# Patient Record
Sex: Female | Born: 1950
Health system: Southern US, Community
[De-identification: ages and names within clinical notes are randomized; demographics above are authoritative.]

## PROBLEM LIST (undated history)

## (undated) DIAGNOSIS — Z8742 Personal history of other diseases of the female genital tract: Secondary | ICD-10-CM

## (undated) DIAGNOSIS — R87629 Unspecified abnormal cytological findings in specimens from vagina: Secondary | ICD-10-CM

## (undated) DIAGNOSIS — I10 Essential (primary) hypertension: Secondary | ICD-10-CM

## (undated) DIAGNOSIS — N816 Rectocele: Secondary | ICD-10-CM

## (undated) DIAGNOSIS — IMO0002 Reserved for concepts with insufficient information to code with codable children: Secondary | ICD-10-CM

## (undated) DIAGNOSIS — R87619 Unspecified abnormal cytological findings in specimens from cervix uteri: Secondary | ICD-10-CM

## (undated) HISTORY — PX: BREAST SURGERY: SHX581

## (undated) HISTORY — PX: OTHER SURGICAL HISTORY: SHX169

## (undated) HISTORY — DX: Unspecified abnormal cytological findings in specimens from cervix uteri: R87.619

## (undated) HISTORY — DX: Reserved for concepts with insufficient information to code with codable children: IMO0002

## (undated) HISTORY — DX: Essential (primary) hypertension: I10

## (undated) HISTORY — DX: Personal history of other diseases of the female genital tract: Z87.42

## (undated) HISTORY — PX: ENDOMETRIAL ABLATION: SHX621

## (undated) HISTORY — DX: Rectocele: N81.6

## (undated) HISTORY — DX: Unspecified abnormal cytological findings in specimens from vagina: R87.629

## (undated) HISTORY — PX: TUBAL LIGATION: SHX77

---

## 2001-08-20 ENCOUNTER — Ambulatory Visit (HOSPITAL_COMMUNITY): Admission: RE | Admit: 2001-08-20 | Discharge: 2001-08-20 | Payer: Self-pay | Admitting: General Surgery

## 2004-05-31 ENCOUNTER — Ambulatory Visit (HOSPITAL_COMMUNITY): Admission: RE | Admit: 2004-05-31 | Discharge: 2004-05-31 | Payer: Self-pay | Admitting: Obstetrics and Gynecology

## 2005-12-17 ENCOUNTER — Encounter (INDEPENDENT_AMBULATORY_CARE_PROVIDER_SITE_OTHER): Payer: Self-pay | Admitting: Family Medicine

## 2005-12-17 LAB — CONVERTED CEMR LAB: Pap Smear: NORMAL

## 2005-12-26 ENCOUNTER — Encounter (INDEPENDENT_AMBULATORY_CARE_PROVIDER_SITE_OTHER): Payer: Self-pay | Admitting: Family Medicine

## 2006-04-03 ENCOUNTER — Encounter (INDEPENDENT_AMBULATORY_CARE_PROVIDER_SITE_OTHER): Payer: Self-pay | Admitting: Family Medicine

## 2006-04-04 ENCOUNTER — Ambulatory Visit: Payer: Self-pay | Admitting: Family Medicine

## 2006-06-05 ENCOUNTER — Encounter: Payer: Self-pay | Admitting: Family Medicine

## 2006-06-05 DIAGNOSIS — L708 Other acne: Secondary | ICD-10-CM

## 2006-06-05 DIAGNOSIS — I10 Essential (primary) hypertension: Secondary | ICD-10-CM | POA: Insufficient documentation

## 2006-06-05 DIAGNOSIS — G5600 Carpal tunnel syndrome, unspecified upper limb: Secondary | ICD-10-CM

## 2006-09-05 ENCOUNTER — Encounter (INDEPENDENT_AMBULATORY_CARE_PROVIDER_SITE_OTHER): Payer: Self-pay | Admitting: Family Medicine

## 2006-12-09 ENCOUNTER — Encounter (INDEPENDENT_AMBULATORY_CARE_PROVIDER_SITE_OTHER): Payer: Self-pay | Admitting: Family Medicine

## 2006-12-09 LAB — CONVERTED CEMR LAB: Pap Smear: NORMAL

## 2007-01-05 ENCOUNTER — Encounter (INDEPENDENT_AMBULATORY_CARE_PROVIDER_SITE_OTHER): Payer: Self-pay | Admitting: Family Medicine

## 2007-01-09 ENCOUNTER — Encounter (INDEPENDENT_AMBULATORY_CARE_PROVIDER_SITE_OTHER): Payer: Self-pay | Admitting: Family Medicine

## 2007-01-13 ENCOUNTER — Ambulatory Visit: Payer: Self-pay | Admitting: Family Medicine

## 2007-01-14 ENCOUNTER — Encounter (INDEPENDENT_AMBULATORY_CARE_PROVIDER_SITE_OTHER): Payer: Self-pay | Admitting: Family Medicine

## 2007-01-14 ENCOUNTER — Telehealth (INDEPENDENT_AMBULATORY_CARE_PROVIDER_SITE_OTHER): Payer: Self-pay | Admitting: *Deleted

## 2007-01-15 ENCOUNTER — Telehealth (INDEPENDENT_AMBULATORY_CARE_PROVIDER_SITE_OTHER): Payer: Self-pay | Admitting: *Deleted

## 2007-01-15 ENCOUNTER — Encounter (INDEPENDENT_AMBULATORY_CARE_PROVIDER_SITE_OTHER): Payer: Self-pay | Admitting: Family Medicine

## 2007-02-05 ENCOUNTER — Encounter (INDEPENDENT_AMBULATORY_CARE_PROVIDER_SITE_OTHER): Payer: Self-pay | Admitting: Family Medicine

## 2007-02-05 ENCOUNTER — Ambulatory Visit: Payer: Self-pay | Admitting: Gastroenterology

## 2007-02-05 ENCOUNTER — Ambulatory Visit (HOSPITAL_COMMUNITY): Admission: RE | Admit: 2007-02-05 | Discharge: 2007-02-05 | Payer: Self-pay | Admitting: Gastroenterology

## 2007-02-24 ENCOUNTER — Ambulatory Visit: Payer: Self-pay | Admitting: Family Medicine

## 2007-04-04 ENCOUNTER — Encounter (INDEPENDENT_AMBULATORY_CARE_PROVIDER_SITE_OTHER): Payer: Self-pay | Admitting: Family Medicine

## 2007-05-20 ENCOUNTER — Ambulatory Visit: Payer: Self-pay | Admitting: Family Medicine

## 2007-05-27 ENCOUNTER — Encounter (INDEPENDENT_AMBULATORY_CARE_PROVIDER_SITE_OTHER): Payer: Self-pay | Admitting: Family Medicine

## 2007-06-11 ENCOUNTER — Telehealth (INDEPENDENT_AMBULATORY_CARE_PROVIDER_SITE_OTHER): Payer: Self-pay | Admitting: Family Medicine

## 2007-06-17 ENCOUNTER — Ambulatory Visit: Payer: Self-pay | Admitting: Family Medicine

## 2007-06-17 LAB — CONVERTED CEMR LAB: Cholesterol, target level: 200 mg/dL

## 2007-06-25 ENCOUNTER — Encounter (INDEPENDENT_AMBULATORY_CARE_PROVIDER_SITE_OTHER): Payer: Self-pay | Admitting: Family Medicine

## 2007-07-29 ENCOUNTER — Ambulatory Visit: Payer: Self-pay | Admitting: Family Medicine

## 2007-08-02 ENCOUNTER — Encounter (INDEPENDENT_AMBULATORY_CARE_PROVIDER_SITE_OTHER): Payer: Self-pay | Admitting: Family Medicine

## 2007-08-04 LAB — CONVERTED CEMR LAB
ALT: 20 units/L (ref 0–35)
BUN: 14 mg/dL (ref 6–23)
Basophils Absolute: 0.1 10*3/uL (ref 0.0–0.1)
Basophils Relative: 1 % (ref 0–1)
CO2: 29 meq/L (ref 19–32)
Calcium: 9.6 mg/dL (ref 8.4–10.5)
Cholesterol: 141 mg/dL (ref 0–200)
Creatinine, Ser: 0.78 mg/dL (ref 0.40–1.20)
Eosinophils Relative: 2 % (ref 0–5)
HCT: 39.1 % (ref 36.0–46.0)
HDL: 55 mg/dL (ref >39)
Hemoglobin: 13 g/dL (ref 12.0–15.0)
Lymphocytes Relative: 24 % (ref 12–46)
MCHC: 33.2 g/dL (ref 30.0–36.0)
Monocytes Absolute: 0.9 10*3/uL (ref 0.1–1.0)
Monocytes Relative: 9 % (ref 3–12)
RBC: 4.5 M/uL (ref 3.87–5.11)
RDW: 13.6 % (ref 11.5–15.5)
Total Bilirubin: 0.5 mg/dL (ref 0.3–1.2)
Total CHOL/HDL Ratio: 2.6
VLDL: 10 mg/dL (ref 0–40)

## 2007-08-05 ENCOUNTER — Telehealth (INDEPENDENT_AMBULATORY_CARE_PROVIDER_SITE_OTHER): Payer: Self-pay | Admitting: *Deleted

## 2007-08-06 ENCOUNTER — Encounter (INDEPENDENT_AMBULATORY_CARE_PROVIDER_SITE_OTHER): Payer: Self-pay | Admitting: Family Medicine

## 2007-12-23 ENCOUNTER — Ambulatory Visit: Payer: Self-pay | Admitting: Family Medicine

## 2007-12-23 LAB — CONVERTED CEMR LAB
Blood in Urine, dipstick: NEGATIVE
Nitrite: NEGATIVE
Protein, U semiquant: NEGATIVE
Urobilinogen, UA: 1
pH: 7

## 2007-12-25 LAB — CONVERTED CEMR LAB
BUN: 13 mg/dL (ref 6–23)
CO2: 29 meq/L (ref 19–32)
Calcium: 9.7 mg/dL (ref 8.4–10.5)
Chloride: 106 meq/L (ref 96–112)
Creatinine, Ser: 0.83 mg/dL (ref 0.40–1.20)
Glucose, Bld: 104 mg/dL — ABNORMAL HIGH (ref 70–99)

## 2008-01-15 ENCOUNTER — Other Ambulatory Visit: Admission: RE | Admit: 2008-01-15 | Discharge: 2008-01-15 | Payer: Self-pay | Admitting: Obstetrics and Gynecology

## 2008-01-15 ENCOUNTER — Encounter (INDEPENDENT_AMBULATORY_CARE_PROVIDER_SITE_OTHER): Payer: Self-pay | Admitting: Family Medicine

## 2008-05-06 ENCOUNTER — Encounter (INDEPENDENT_AMBULATORY_CARE_PROVIDER_SITE_OTHER): Payer: Self-pay | Admitting: Family Medicine

## 2008-05-12 ENCOUNTER — Encounter (INDEPENDENT_AMBULATORY_CARE_PROVIDER_SITE_OTHER): Payer: Self-pay | Admitting: Family Medicine

## 2008-05-17 ENCOUNTER — Ambulatory Visit: Payer: Self-pay | Admitting: Family Medicine

## 2008-05-18 ENCOUNTER — Encounter (INDEPENDENT_AMBULATORY_CARE_PROVIDER_SITE_OTHER): Payer: Self-pay | Admitting: Family Medicine

## 2008-05-18 LAB — CONVERTED CEMR LAB: Mumps IgG: 2.32 — ABNORMAL HIGH

## 2008-05-24 ENCOUNTER — Encounter (INDEPENDENT_AMBULATORY_CARE_PROVIDER_SITE_OTHER): Payer: Self-pay | Admitting: Family Medicine

## 2008-05-27 ENCOUNTER — Encounter (INDEPENDENT_AMBULATORY_CARE_PROVIDER_SITE_OTHER): Payer: Self-pay | Admitting: Family Medicine

## 2008-06-14 ENCOUNTER — Ambulatory Visit: Payer: Self-pay | Admitting: Family Medicine

## 2008-06-16 ENCOUNTER — Encounter (INDEPENDENT_AMBULATORY_CARE_PROVIDER_SITE_OTHER): Payer: Self-pay | Admitting: Family Medicine

## 2008-06-16 LAB — CONVERTED CEMR LAB
BUN: 13 mg/dL (ref 6–23)
CO2: 21 meq/L (ref 19–32)
Chloride: 104 meq/L (ref 96–112)
Creatinine, Ser: 0.8 mg/dL (ref 0.40–1.20)
Potassium: 4 meq/L (ref 3.5–5.3)

## 2008-08-30 ENCOUNTER — Encounter (INDEPENDENT_AMBULATORY_CARE_PROVIDER_SITE_OTHER): Payer: Self-pay | Admitting: Family Medicine

## 2008-10-15 ENCOUNTER — Ambulatory Visit: Payer: Self-pay | Admitting: Family Medicine

## 2008-10-29 ENCOUNTER — Ambulatory Visit: Payer: Self-pay | Admitting: Family Medicine

## 2008-10-29 DIAGNOSIS — K279 Peptic ulcer, site unspecified, unspecified as acute or chronic, without hemorrhage or perforation: Secondary | ICD-10-CM | POA: Insufficient documentation

## 2008-11-03 ENCOUNTER — Encounter (INDEPENDENT_AMBULATORY_CARE_PROVIDER_SITE_OTHER): Payer: Self-pay | Admitting: Family Medicine

## 2008-11-15 ENCOUNTER — Ambulatory Visit: Payer: Self-pay | Admitting: Family Medicine

## 2008-12-31 ENCOUNTER — Telehealth (INDEPENDENT_AMBULATORY_CARE_PROVIDER_SITE_OTHER): Payer: Self-pay | Admitting: *Deleted

## 2009-02-17 ENCOUNTER — Other Ambulatory Visit: Admission: RE | Admit: 2009-02-17 | Discharge: 2009-02-17 | Payer: Self-pay | Admitting: Adult Health

## 2009-03-29 ENCOUNTER — Encounter (INDEPENDENT_AMBULATORY_CARE_PROVIDER_SITE_OTHER): Payer: Self-pay | Admitting: Family Medicine

## 2010-11-21 NOTE — Op Note (Signed)
NAME:  Kristy Oneal, Kristy Oneal                ACCOUNT NO.:  [id number]   MEDICAL RECORD NO.:  [id number]          PATIENT TYPE:  AMB   LOCATION:  DAY                           FACILITY:  [facility]   PHYSICIAN:  Kassie Mends, M.D.      DATE OF BIRTH:  15-Feb-1951   DATE OF PROCEDURE:  02/05/2007  DATE OF DISCHARGE:                               OPERATIVE REPORT   PROCEDURE:  Colonoscopy.   INDICATIONS FOR EXAM:  Kristy Oneal is a 60 year old female who presented  for average risk colon cancer screening.   FINDINGS:  1. Normal colon without evidence of polyps, masses, inflammatory      changes, diverticula, or AVMs.  2. Normal retroflexed view of the rectum.   RECOMMENDATIONS:  1. Screening colonoscopy in ten years.  2. She should follow a high fiber diet, she is given a hand out on      high fiber diet.   MEDICATIONS:  1. Demerol 50 mg IV.  2. Versed 2 mg IV.   PROCEDURE TECHNIQUE:  Physical exam was performed and informed consent  was obtained from the patient after explaining the benefits, risks, and  alternatives to the procedure.  The patient was connected to the monitor  and placed in the left lateral position.  Continuous oxygen was provided  by nasal cannula and IV medicines administered through an indwelling  cannula.  After administration of sedation and rectal exam, the  patient's rectum was intubated.  The scope was advanced under direct  visualization to the cecum.  The scope was removed slowly by carefully  examining the color, texture, anatomy, and integrity of the mucosa on  the way out.  The patient was recovered in the endoscopy suite and  discharged home in satisfactory condition.      Kassie Mends, M.D.  Electronically Signed     SM/MEDQ  D:  02/05/2007  T:  02/05/2007  Job:  [id number]   cc:   Franchot Heidelberg, M.D.

## 2010-11-24 NOTE — Op Note (Signed)
NAME:  Kristy Oneal, Kristy Oneal                ACCOUNT NO.:  [id number]   MEDICAL RECORD NO.:  [id number]          PATIENT TYPE:  AMB   LOCATION:  DAY                           FACILITY:  [facility]   PHYSICIAN:  Tilda Burrow, M.D. DATE OF BIRTH:  1951/03/10   DATE OF PROCEDURE:  05/31/2004  DATE OF DISCHARGE:  05/31/2004                                 OPERATIVE REPORT   PREOPERATIVE DIAGNOSIS:  Heavy and prolonged menses on hormone replacement  therapy.   POSTOPERATIVE DIAGNOSIS:  Heavy and prolonged menses on hormone replacement  therapy.   PROCEDURES:  Hysteroscopy, dilation and curettage, endometrial ablation,  cervix biopsy.   SURGEON:  Tilda Burrow, M.D.   ASSISTANT:  None.   ANESTHESIA:  General.   COMPLICATIONS:  None.   FINDINGS:  Thin uterine cavity.  Benign cervical mucus.   DETAILS OF PROCEDURE:  The patient was taken to the operating room, prepped  and draped for low lithotomy, leg support, [place] procedure.  Speculum was  inserted and the cervix grasped with single-tooth tenaculum, paracervical  block was infiltrated.  We dilated the cervix to 61 [place], allowing the  introduction of rigid hysteroscope and the water-based hysteroscopy  performed.  Endometrial cavity was relatively thin.  Recent abnormal Pap  smear resulted in endocervical curettage and cervix biopsy to be performed.  The specimens were taken out without difficulty.  Dilation and curettage was  then performed with smooth sharp curetting in all quadrants to obtain any  tissue remnants.  There were no identified polyps.  We then used the  endometrial ablation device to insert it into the uterus and perform thermal  ablation using the HTA endometrial ablation equipment with smooth, sharp  uniform appearance to the endometrial cavity after its 10-minute heat cycle.  The uterus sounded only to a depth of 8 cm.  The procedure was successfully  completed and the patient then allowed to go to the recovery room  in good  condition.     [name]   JVF/MEDQ  D:  06/14/2004  T:  06/15/2004  Job:  [id number]

## 2010-11-24 NOTE — H&P (Signed)
[facility]  Patient:    Kristy Oneal, Kristy Oneal Visit Number: [id number] MRN: [id number]          Service Type: Attending:  Elpidio Anis, M.D. Dictated by:   Elpidio Anis, M.D. Adm. Date:  08/20/01                           History and Physical  HISTORY OF PRESENT ILLNESS:  A 60 year old female with family history of colon cancer.  She has chronic anemia.  No history of rectal bleeding or melena.  No difficulty with constipation.  PAST MEDICAL HISTORY:  Hypertension, peptic ulcer disease, pancreatitis.  MEDICATIONS:  Lotrel, Cardura, and an iron supplement.  PAST SURGICAL HISTORY:  Right breast biopsy and cauterization of cervix.  REVIEW OF SYSTEMS:  Only positive for heartburn and mild abdominal pain.  PHYSICAL EXAMINATION:  VITAL SIGNS:  Blood pressure 130/82, pulse 84, respirations 18, weight 123 pounds.  HEENT:  Unremarkable.  NECK:  Supple without JVD or bruit.  CHEST:  Clear to auscultation.  HEART:  Regular rate and rhythm without murmur, gallop, or rub.  ABDOMEN:  Soft and nontender.  No masses.  EXTREMITIES:  No cyanosis, clubbing, or edema.  NEUROLOGIC:  Nonfocal motor, sensory, or cerebellar deficit.  IMPRESSION: 1. Family history of colon cancer. 2. Chronic anemia. 3. Hypertension. 4. Peptic ulcer disease.  PLAN:  Total colonoscopy.  The patient was thoroughly counseled for the surgery and potential complications.  Written literature was given. Dictated by:   Elpidio Anis, M.D. Attending:  Elpidio Anis, M.D. DD:  08/19/01 TD:  08/19/01 Job: [id number]

## 2011-03-08 ENCOUNTER — Other Ambulatory Visit: Payer: Self-pay | Admitting: Obstetrics & Gynecology

## 2011-03-08 ENCOUNTER — Other Ambulatory Visit (HOSPITAL_COMMUNITY)
Admission: RE | Admit: 2011-03-08 | Discharge: 2011-03-08 | Disposition: A | Discharge status: Home / Self Care | Payer: BC Managed Care – PPO | Source: Ambulatory Visit | Attending: Obstetrics and Gynecology | Admitting: Obstetrics and Gynecology

## 2011-03-08 ENCOUNTER — Other Ambulatory Visit: Payer: Self-pay | Admitting: Adult Health

## 2011-03-08 DIAGNOSIS — Z139 Encounter for screening, unspecified: Secondary | ICD-10-CM

## 2011-03-08 DIAGNOSIS — Z780 Asymptomatic menopausal state: Secondary | ICD-10-CM

## 2011-03-08 DIAGNOSIS — Z01419 Encounter for gynecological examination (general) (routine) without abnormal findings: Secondary | ICD-10-CM | POA: Insufficient documentation

## 2011-03-08 DIAGNOSIS — R6252 Short stature (child): Secondary | ICD-10-CM

## 2011-03-14 ENCOUNTER — Ambulatory Visit (HOSPITAL_COMMUNITY)
Admission: RE | Admit: 2011-03-14 | Discharge: 2011-03-14 | Disposition: A | Discharge status: Home / Self Care | Payer: BC Managed Care – PPO | Source: Ambulatory Visit | Attending: Obstetrics & Gynecology | Admitting: Obstetrics & Gynecology

## 2011-03-14 DIAGNOSIS — M899 Disorder of bone, unspecified: Secondary | ICD-10-CM | POA: Insufficient documentation

## 2011-03-14 DIAGNOSIS — Z780 Asymptomatic menopausal state: Secondary | ICD-10-CM | POA: Insufficient documentation

## 2011-03-14 DIAGNOSIS — R6252 Short stature (child): Secondary | ICD-10-CM

## 2011-03-14 DIAGNOSIS — Z139 Encounter for screening, unspecified: Secondary | ICD-10-CM

## 2011-03-14 DIAGNOSIS — Z13820 Encounter for screening for osteoporosis: Secondary | ICD-10-CM | POA: Insufficient documentation

## 2011-08-06 ENCOUNTER — Ambulatory Visit (HOSPITAL_COMMUNITY): Payer: Self-pay | Admitting: Oncology

## 2012-03-13 ENCOUNTER — Other Ambulatory Visit (HOSPITAL_COMMUNITY)
Admission: RE | Admit: 2012-03-13 | Discharge: 2012-03-13 | Disposition: A | Discharge status: Home / Self Care | Payer: BC Managed Care – PPO | Source: Ambulatory Visit | Attending: Obstetrics and Gynecology | Admitting: Obstetrics and Gynecology

## 2012-03-13 ENCOUNTER — Other Ambulatory Visit: Payer: Self-pay | Admitting: Adult Health

## 2012-03-13 DIAGNOSIS — Z01419 Encounter for gynecological examination (general) (routine) without abnormal findings: Secondary | ICD-10-CM | POA: Insufficient documentation

## 2012-03-13 DIAGNOSIS — R87810 Cervical high risk human papillomavirus (HPV) DNA test positive: Secondary | ICD-10-CM | POA: Insufficient documentation

## 2013-03-18 ENCOUNTER — Other Ambulatory Visit (HOSPITAL_COMMUNITY)
Admission: RE | Admit: 2013-03-18 | Discharge: 2013-03-18 | Disposition: A | Discharge status: Home / Self Care | Payer: BC Managed Care – PPO | Source: Ambulatory Visit | Attending: Adult Health | Admitting: Adult Health

## 2013-03-18 ENCOUNTER — Ambulatory Visit (INDEPENDENT_AMBULATORY_CARE_PROVIDER_SITE_OTHER): Payer: BC Managed Care – PPO | Admitting: Adult Health

## 2013-03-18 ENCOUNTER — Encounter: Payer: Self-pay | Admitting: Adult Health

## 2013-03-18 VITALS — BP 128/76 | HR 72 | Ht 61.0 in | Wt 113.0 lb

## 2013-03-18 DIAGNOSIS — Z01419 Encounter for gynecological examination (general) (routine) without abnormal findings: Secondary | ICD-10-CM | POA: Insufficient documentation

## 2013-03-18 DIAGNOSIS — Z1151 Encounter for screening for human papillomavirus (HPV): Secondary | ICD-10-CM | POA: Insufficient documentation

## 2013-03-18 DIAGNOSIS — Z1212 Encounter for screening for malignant neoplasm of rectum: Secondary | ICD-10-CM

## 2013-03-18 LAB — HEMOCCULT GUIAC POC 1CARD (OFFICE)

## 2013-03-18 NOTE — Progress Notes (Signed)
Patient ID: Kristy Oneal, female   DOB: [birth year], 62 y.o.   MRN: [id number] History of Present Illness: Kristy Oneal is a 62 year old black female married in for pap and physical.   Current Medications, Allergies, Past Medical History, Past Surgical History, Family History and Social History were reviewed in [facility] electronic medical record.     Review of Systems: Patient denies any headaches, blurred vision, shortness of breath, chest pain, abdominal pain, problems with bowel movements, urination, or intercourse. No joint pains or mood chnages    Physical Exam:BP 128/76  Pulse 72  Ht 5\' 1"  (1.549 m)  Wt 113 lb (51.256 kg)  BMI 21.36 kg/m2 General:  Well developed, well nourished, no acute distress Skin:  Warm and dry Neck:  Midline trachea, normal thyroid Lungs; Clear to auscultation bilaterally Breast:  No dominant palpable mass, retraction, or nipple discharge Cardiovascular: Regular rate and rhythm Abdomen:  Soft, non tender, no hepatosplenomegaly Pelvic:  External genitalia is normal in appearance.  The vagina is normal in appearance. The cervix is atrophic, pap with HPV performed.  Uterus is felt to be normal size, shape, and contour.  No                adnexal masses or tenderness noted. Rectal: Good sphincter tone, no polyps, or hemorrhoids felt.  Hemoccult negative. Extremities:  No swelling or varicosities noted Psych:  Alert and cooperative, seems happy, still working and recently adopted 62 year old girl from her niece.   Impression: Yearly gyn exam History hypertension    Plan: Physical in 1 year Mammogram yearly Colonoscopy per GI Labs in near future Get flu shot

## 2013-03-18 NOTE — Patient Instructions (Addendum)
Physical in 1 year Mammogram yearly Colonoscopy per GI Labs near future Get flu shot

## 2013-03-24 ENCOUNTER — Telehealth: Payer: Self-pay | Admitting: Adult Health

## 2013-03-24 NOTE — Telephone Encounter (Signed)
Left message to call about pap in am

## 2013-04-06 ENCOUNTER — Encounter: Payer: Self-pay | Admitting: Obstetrics & Gynecology

## 2013-04-06 ENCOUNTER — Ambulatory Visit (INDEPENDENT_AMBULATORY_CARE_PROVIDER_SITE_OTHER): Payer: BC Managed Care – PPO | Admitting: Obstetrics & Gynecology

## 2013-04-06 VITALS — BP 128/80 | Ht 60.0 in | Wt 113.5 lb

## 2013-04-06 DIAGNOSIS — N870 Mild cervical dysplasia: Secondary | ICD-10-CM

## 2013-04-06 NOTE — Progress Notes (Signed)
Patient ID: Kristy Oneal, female   DOB: 1950-12-05, 62 y.o.   MRN: [id number]       Kesley had pap which revealed LSIL no High risk HPV was detected Colposcopy today reveals negative acetowhite changes, no mosaicism no punctation and no abnormal vessels  Recommend repeat pap in 1 yearsince has negative HPV

## 2014-03-22 ENCOUNTER — Encounter: Payer: Self-pay | Admitting: Adult Health

## 2014-03-22 ENCOUNTER — Ambulatory Visit (INDEPENDENT_AMBULATORY_CARE_PROVIDER_SITE_OTHER): Payer: BC Managed Care – PPO | Admitting: Adult Health

## 2014-03-22 ENCOUNTER — Other Ambulatory Visit (HOSPITAL_COMMUNITY)
Admission: RE | Admit: 2014-03-22 | Discharge: 2014-03-22 | Disposition: A | Discharge status: Home / Self Care | Payer: BC Managed Care – PPO | Source: Ambulatory Visit | Attending: Adult Health | Admitting: Adult Health

## 2014-03-22 VITALS — BP 130/70 | HR 74 | Ht 61.0 in | Wt 110.5 lb

## 2014-03-22 DIAGNOSIS — Z01419 Encounter for gynecological examination (general) (routine) without abnormal findings: Secondary | ICD-10-CM

## 2014-03-22 DIAGNOSIS — Z1151 Encounter for screening for human papillomavirus (HPV): Secondary | ICD-10-CM | POA: Diagnosis present

## 2014-03-22 DIAGNOSIS — N816 Rectocele: Secondary | ICD-10-CM

## 2014-03-22 DIAGNOSIS — Z8742 Personal history of other diseases of the female genital tract: Secondary | ICD-10-CM | POA: Insufficient documentation

## 2014-03-22 DIAGNOSIS — Z1212 Encounter for screening for malignant neoplasm of rectum: Secondary | ICD-10-CM

## 2014-03-22 DIAGNOSIS — R87810 Cervical high risk human papillomavirus (HPV) DNA test positive: Secondary | ICD-10-CM | POA: Diagnosis present

## 2014-03-22 HISTORY — DX: Personal history of other diseases of the female genital tract: Z87.42

## 2014-03-22 HISTORY — DX: Rectocele: N81.6

## 2014-03-22 LAB — CBC
HCT: 36.9 % (ref 36.0–46.0)
HEMOGLOBIN: 12.3 g/dL (ref 12.0–15.0)
MCH: 28.1 pg (ref 26.0–34.0)
MCHC: 33.3 g/dL (ref 30.0–36.0)
MCV: 84.2 fL (ref 78.0–100.0)
Platelets: 252 10*3/uL (ref 150–400)
RBC: 4.38 MIL/uL (ref 3.87–5.11)
RDW: 13.5 % (ref 11.5–15.5)
WBC: 4.9 10*3/uL (ref 4.0–10.5)

## 2014-03-22 LAB — COMPREHENSIVE METABOLIC PANEL
ALT: 17 U/L (ref 0–35)
AST: 21 U/L (ref 0–37)
Albumin: 4.2 g/dL (ref 3.5–5.2)
Alkaline Phosphatase: 72 U/L (ref 39–117)
BUN: 10 mg/dL (ref 6–23)
CO2: 33 meq/L — AB (ref 19–32)
Calcium: 9.6 mg/dL (ref 8.4–10.5)
Chloride: 103 mEq/L (ref 96–112)
Creat: 0.67 mg/dL (ref 0.50–1.10)
Glucose, Bld: 86 mg/dL (ref 70–99)
Potassium: 3.8 mEq/L (ref 3.5–5.3)
Sodium: 139 mEq/L (ref 135–145)
Total Bilirubin: 0.4 mg/dL (ref 0.2–1.2)
Total Protein: 6.6 g/dL (ref 6.0–8.3)

## 2014-03-22 LAB — LIPID PANEL
CHOLESTEROL: 159 mg/dL (ref 0–200)
HDL: 62 mg/dL (ref >39)
LDL Cholesterol: 71 mg/dL (ref 0–99)
Total CHOL/HDL Ratio: 2.6 Ratio
Triglycerides: 129 mg/dL (ref <150)
VLDL: 26 mg/dL (ref 0–40)

## 2014-03-22 LAB — HEMOCCULT GUIAC POC 1CARD (OFFICE): FECAL OCCULT BLD: NEGATIVE

## 2014-03-22 LAB — TSH: TSH: 1.07 u[IU]/mL (ref 0.350–4.500)

## 2014-03-22 NOTE — Patient Instructions (Signed)
Continue meds Physical in 1 year Mammogram yearly Colonoscopy 2019

## 2014-03-22 NOTE — Progress Notes (Signed)
Patient ID: Kristy Oneal, female   DOB: July 19, 1950, 63 y.o.   MRN: [id number] History of Present Illness: Kristy Oneal is a 63 year old black female, married in for a pap and physical.She had a abnormal pap,LSIL,03/18/13.   Current Medications, Allergies, Past Medical History, Past Surgical History, Family History and Social History were reviewed in [employer] record.     Review of Systems: Patient denies any headaches, blurred vision, shortness of breath, chest pain, abdominal pain, problems with bowel movements, urination, or intercourse. No joint pan or mood swings.    Physical Exam:BP 130/70  Pulse 74  Ht  (1.549 m)  Wt 110 lb 8 oz (50.122 kg)  BMI 20.89 kg/m2 General:  Well developed, well nourished, no acute distress Skin:  Warm and dry Neck:  Midline trachea, normal thyroid Lungs; Clear to auscultation bilaterally Breast:  No dominant palpable mass, retraction, or nipple discharge Cardiovascular: Regular rate and rhythm Abdomen:  Soft, non tender, no hepatosplenomegaly Pelvic:  External genitalia is normal in appearance.  The vagina is normal in appearance. The cervix is smooth and stenotic at os,pap with HPV performed.  Uterus is felt to be normal size, shape, and contour.  No  adnexal masses or tenderness noted. Rectal: Good sphincter tone, no polyps, or hemorrhoids felt.  Hemoccult negative.+ mild rectocele. Extremities:  No swelling or varicosities noted Psych:  No mood changes,alert and cooperative,seems happy,still working   Impression: Yearly gyn exam  History of abnormal pap Rectocele     Plan: Physical in 1 year Mammogram yearly Colonoscopy in 2019 Check CBC,CMP,TSH and lipids

## 2014-03-23 LAB — CYTOLOGY - PAP

## 2014-03-24 ENCOUNTER — Telehealth: Payer: Self-pay | Admitting: Adult Health

## 2014-03-24 NOTE — Telephone Encounter (Signed)
Left message to call tomorrow for labs

## 2014-03-25 ENCOUNTER — Telehealth: Payer: Self-pay | Admitting: Adult Health

## 2014-03-25 NOTE — Telephone Encounter (Signed)
Pt aware labs great and pap normal with +HPV, will repeat pap next year

## 2014-05-10 ENCOUNTER — Encounter: Payer: Self-pay | Admitting: Adult Health

## 2014-09-09 ENCOUNTER — Other Ambulatory Visit (HOSPITAL_COMMUNITY): Payer: Self-pay | Admitting: Family Medicine

## 2014-09-09 DIAGNOSIS — N951 Menopausal and female climacteric states: Secondary | ICD-10-CM

## 2014-09-14 ENCOUNTER — Other Ambulatory Visit (HOSPITAL_COMMUNITY): Payer: BC Managed Care – PPO

## 2014-10-05 ENCOUNTER — Ambulatory Visit (HOSPITAL_COMMUNITY)
Admission: RE | Admit: 2014-10-05 | Discharge: 2014-10-05 | Disposition: A | Discharge status: Home / Self Care | Payer: BC Managed Care – PPO | Source: Ambulatory Visit | Attending: Family Medicine | Admitting: Family Medicine

## 2014-10-05 DIAGNOSIS — N951 Menopausal and female climacteric states: Secondary | ICD-10-CM | POA: Diagnosis not present

## 2015-03-30 ENCOUNTER — Ambulatory Visit (INDEPENDENT_AMBULATORY_CARE_PROVIDER_SITE_OTHER): Payer: BC Managed Care – PPO | Admitting: Adult Health

## 2015-03-30 ENCOUNTER — Other Ambulatory Visit: Payer: BC Managed Care – PPO | Admitting: Adult Health

## 2015-03-30 ENCOUNTER — Other Ambulatory Visit (HOSPITAL_COMMUNITY)
Admission: RE | Admit: 2015-03-30 | Discharge: 2015-03-30 | Disposition: A | Discharge status: Home / Self Care | Payer: BC Managed Care – PPO | Source: Ambulatory Visit | Attending: Adult Health | Admitting: Adult Health

## 2015-03-30 ENCOUNTER — Encounter: Payer: Self-pay | Admitting: Adult Health

## 2015-03-30 VITALS — BP 110/64 | HR 84 | Ht 60.0 in | Wt 113.0 lb

## 2015-03-30 DIAGNOSIS — Z01419 Encounter for gynecological examination (general) (routine) without abnormal findings: Secondary | ICD-10-CM

## 2015-03-30 DIAGNOSIS — Z8742 Personal history of other diseases of the female genital tract: Secondary | ICD-10-CM

## 2015-03-30 DIAGNOSIS — Z1212 Encounter for screening for malignant neoplasm of rectum: Secondary | ICD-10-CM | POA: Diagnosis not present

## 2015-03-30 DIAGNOSIS — Z1151 Encounter for screening for human papillomavirus (HPV): Secondary | ICD-10-CM | POA: Insufficient documentation

## 2015-03-30 LAB — HEMOCCULT GUIAC POC 1CARD (OFFICE): Fecal Occult Blood, POC: NEGATIVE

## 2015-03-30 NOTE — Progress Notes (Signed)
Patient ID: Kristy Oneal, female   DOB: 04/07/1951, 64 y.o.   MRN: [id number] History of Present Illness: [name] is a 64 year old black female, married in for a well woman gyn exam and pap. PCP is S.Jackson,PA at [place].  Current Medications, Allergies, Past Medical History, Past Surgical History, Family History and Social History were reviewed in [employer] record.     Review of Systems: Patient denies any headaches, hearing loss, fatigue, blurred vision, shortness of breath, chest pain, abdominal pain, problems with bowel movements, urination, or intercourse. No joint pain or mood swings.    Physical Exam:BP 110/64 mmHg  Pulse 84  Ht 5' (1.524 m)  Wt 113 lb (51.256 kg)  BMI 22.07 kg/m2 General:  Well developed, well nourished, no acute distress Skin:  Warm and dry Neck:  Midline trachea, normal thyroid, good ROM, no lymphadenopathy Lungs; Clear to auscultation bilaterally Breast:  No dominant palpable mass, retraction, or nipple discharge Cardiovascular: Regular rate and rhythm Abdomen:  Soft, non tender, no hepatosplenomegaly Pelvic:  External genitalia is normal in appearance, no lesions.  The vagina is normal in appearance. Urethra has no lesions or masses. The cervix is smooth and stenotic at the os, pap with HPV performed.  Uterus is felt to be normal size, shape, and contour.  No adnexal masses or tenderness noted.Bladder is non tender, no masses felt. Rectal: Good sphincter tone, no polyps, or hemorrhoids felt.  Hemoccult negative.+rectocele Extremities/musculoskeletal:  No swelling or varicosities noted, no clubbing or cyanosis Psych:  No mood changes, alert and cooperative,seems happy   Impression: Well woman gyn exam and pap History of abnormal pap    Plan: Physical in 1 year Mammogram yearly Labs with PCP Colonoscopy per GI

## 2015-03-30 NOTE — Patient Instructions (Signed)
Physical in 1 year Mammogram yearly Labs with PCP Colonoscopy per GI 

## 2015-04-05 LAB — CYTOLOGY - PAP

## 2015-04-07 ENCOUNTER — Telehealth: Payer: Self-pay | Admitting: Adult Health

## 2015-04-07 NOTE — Telephone Encounter (Signed)
Pt aware pap negative but +HPV, will repeat pap in 1 year 

## 2016-03-07 DIAGNOSIS — I10 Essential (primary) hypertension: Secondary | ICD-10-CM | POA: Diagnosis not present

## 2016-03-07 DIAGNOSIS — Z6821 Body mass index (BMI) 21.0-21.9, adult: Secondary | ICD-10-CM | POA: Diagnosis not present

## 2016-03-09 IMAGING — US US RENAL
1 series · 14 of 25 positions shown · non-contrast
Comparison: None.

CLINICAL DATA: Acute on chronic renal failure, chronic kidney
disease stage 3.

EXAM:
RENAL / URINARY TRACT ULTRASOUND COMPLETE

[Series 1: us renal · 0.19mm/px · 14 of 50 slices shown]
[im 1/50]
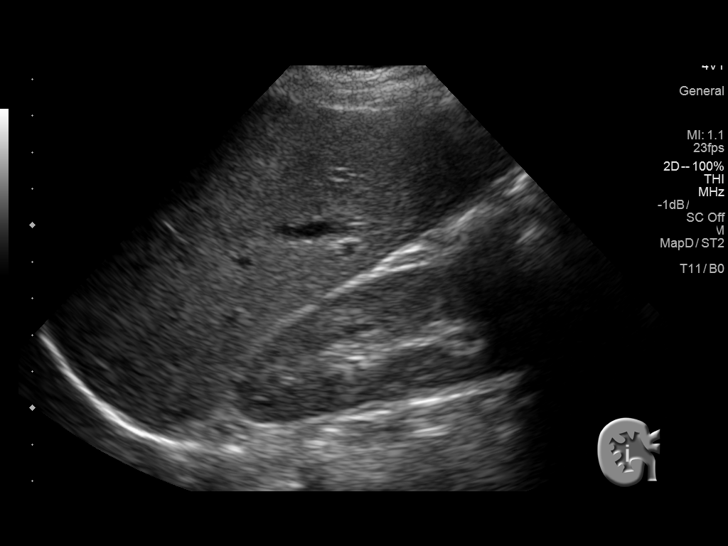
[im 5/50]
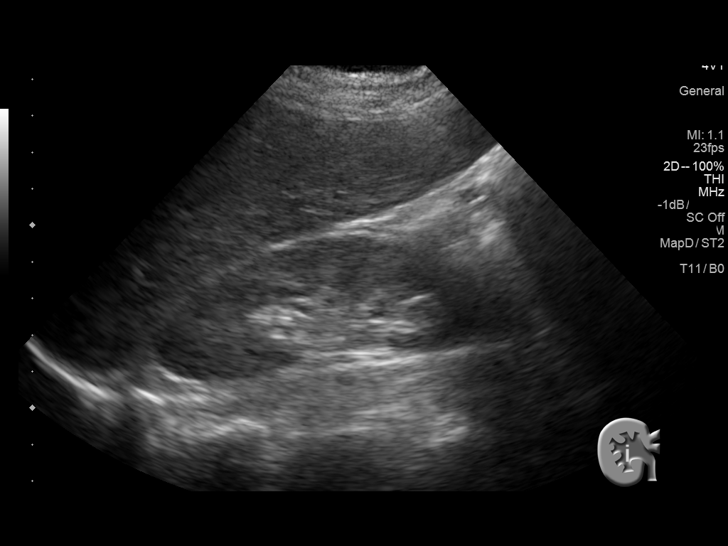
[im 9/50]
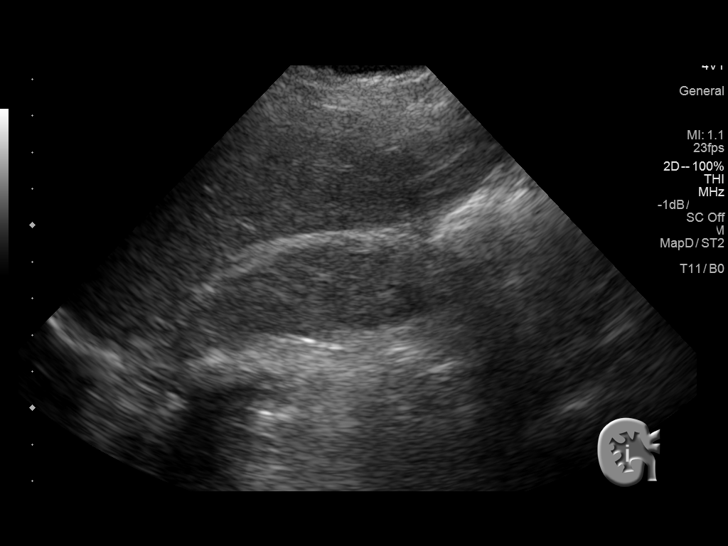
[im 13/50]
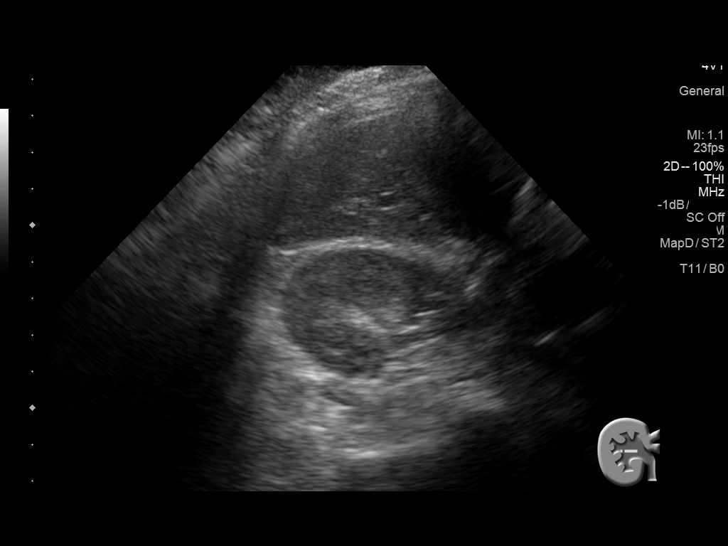
[im 17/50]
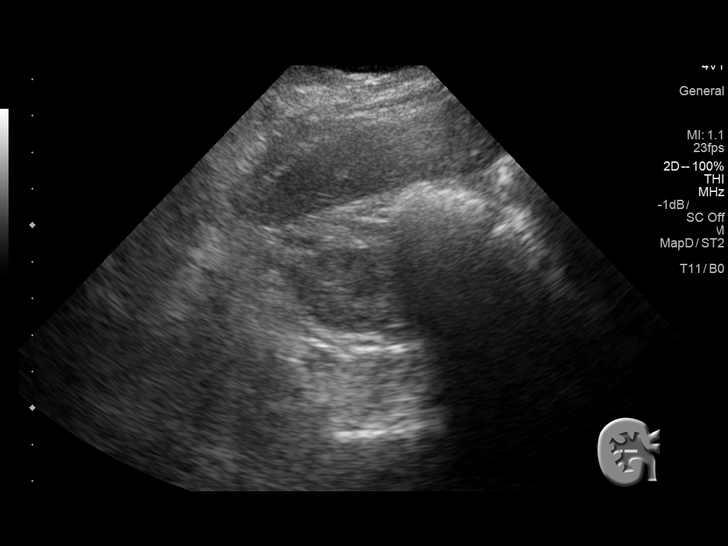
[im 19/50]
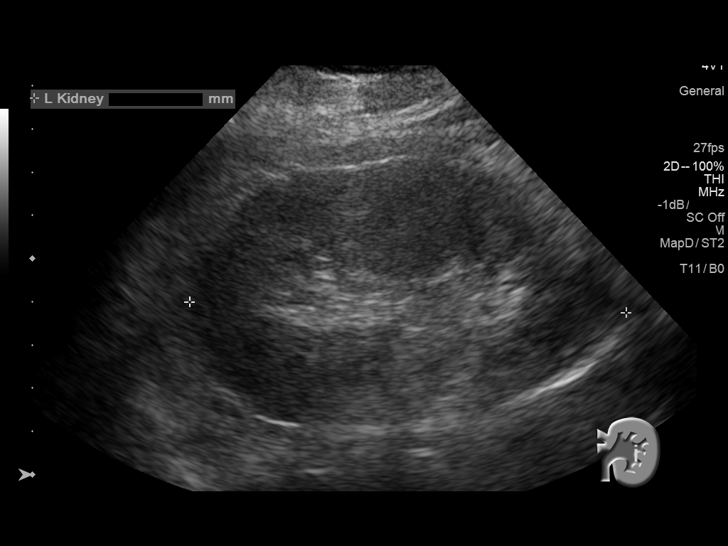
[im 23/50]
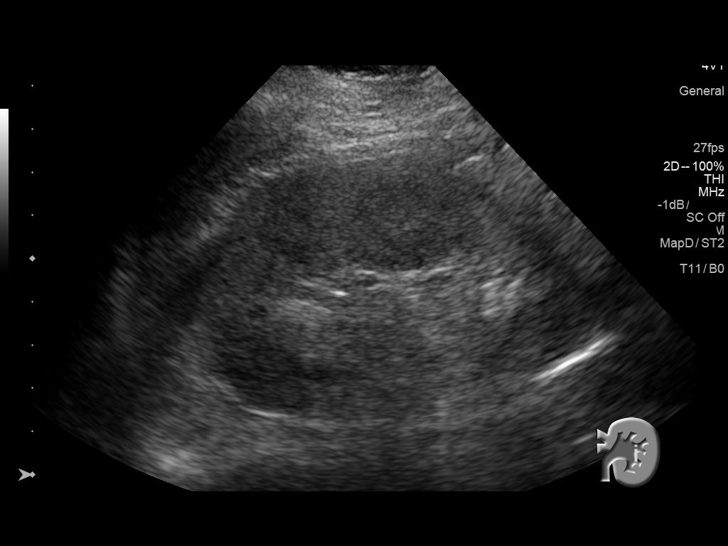
[im 27/50]
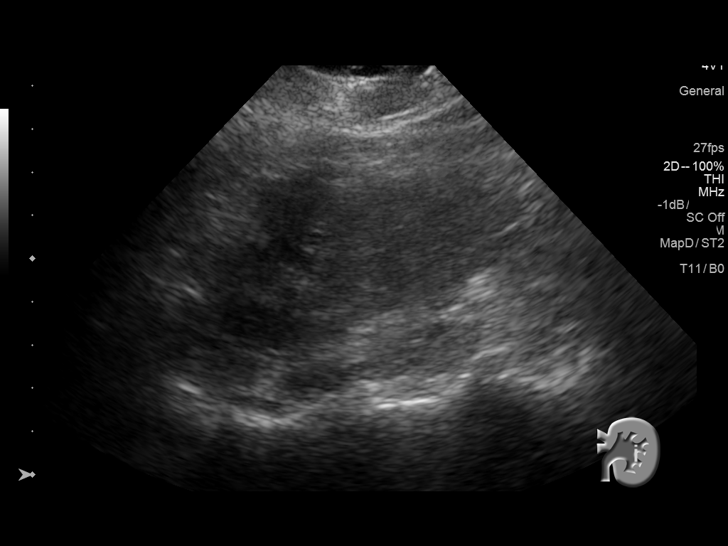
[im 31/50]
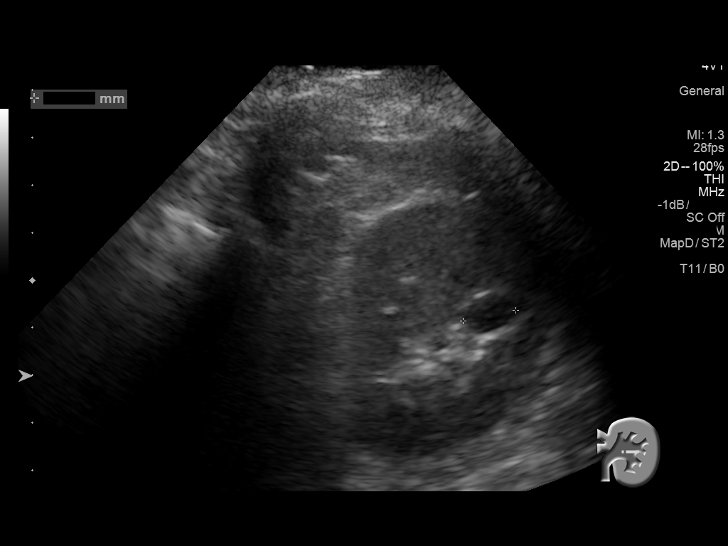
[im 33/50]
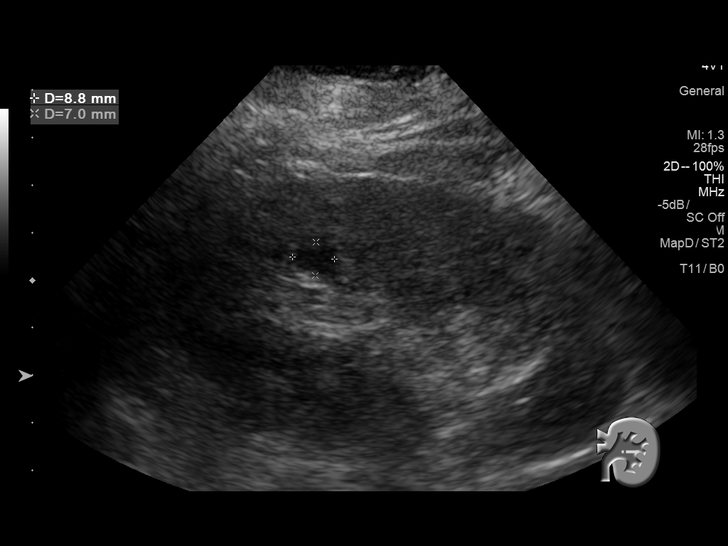
[im 37/50]
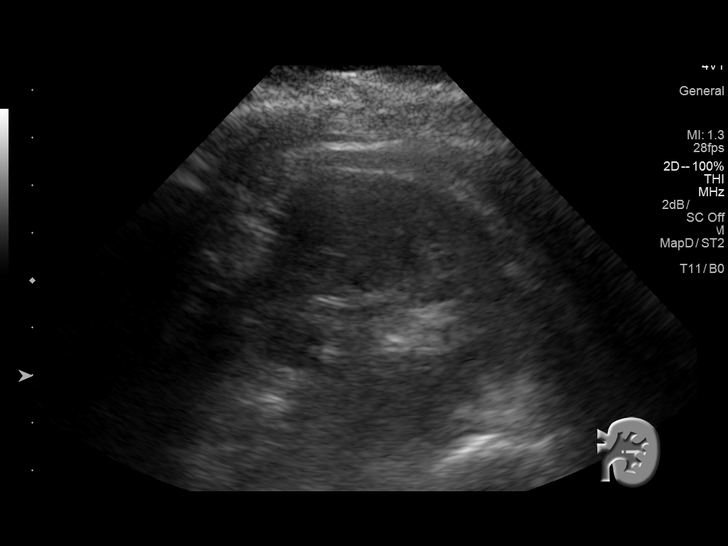
[im 41/50]
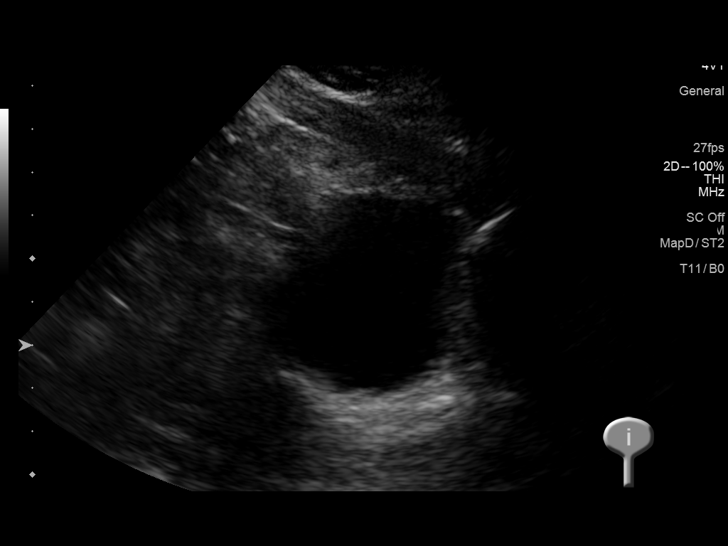
[im 45/50]
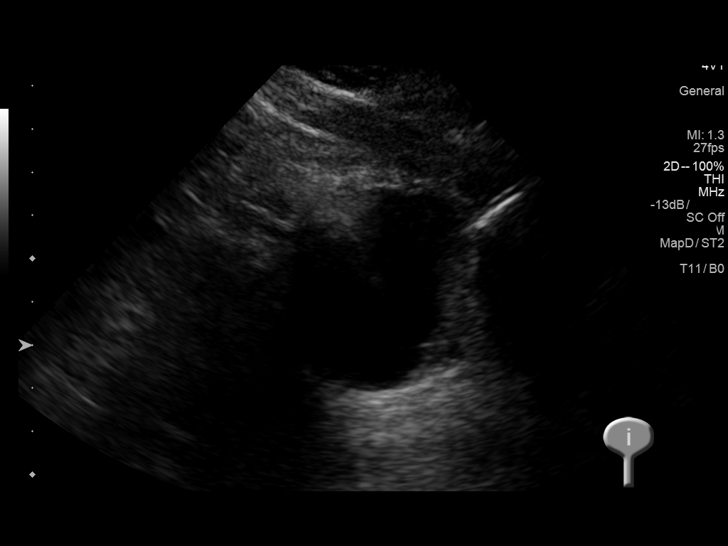
[im 50/50]
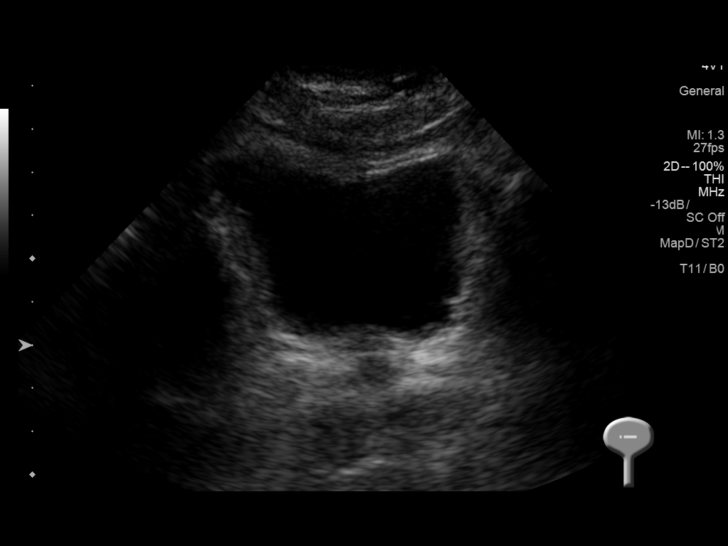

[14 of 25 positions shown; findings below may reference images not displayed]

FINDINGS: Right Kidney:

Length: 10.3 cm. Echogenicity within normal limits. No mass or
hydronephrosis visualized.

Left Kidney:

Length: 10.1 cm. 1.1 cm simple cyst is noted in upper pole.
Echogenicity within normal limits. No mass or hydronephrosis
visualized.

Bladder:

Appears normal for degree of bladder distention.
IMPRESSION: Simple left renal cyst.  No other renal abnormality seen.

## 2016-03-23 DIAGNOSIS — R944 Abnormal results of kidney function studies: Secondary | ICD-10-CM | POA: Diagnosis not present

## 2016-04-09 DIAGNOSIS — Z1231 Encounter for screening mammogram for malignant neoplasm of breast: Secondary | ICD-10-CM | POA: Diagnosis not present

## 2016-04-20 ENCOUNTER — Encounter: Payer: Self-pay | Admitting: Adult Health

## 2016-04-20 ENCOUNTER — Other Ambulatory Visit (HOSPITAL_COMMUNITY)
Admission: RE | Admit: 2016-04-20 | Discharge: 2016-04-20 | Disposition: A | Discharge status: Home / Self Care | Payer: Medicare Other | Source: Ambulatory Visit | Attending: Adult Health | Admitting: Adult Health

## 2016-04-20 ENCOUNTER — Ambulatory Visit (INDEPENDENT_AMBULATORY_CARE_PROVIDER_SITE_OTHER): Payer: Medicare Other | Admitting: Adult Health

## 2016-04-20 VITALS — BP 136/80 | HR 104 | Ht 61.0 in | Wt 118.0 lb

## 2016-04-20 DIAGNOSIS — Z1212 Encounter for screening for malignant neoplasm of rectum: Secondary | ICD-10-CM

## 2016-04-20 DIAGNOSIS — Z01411 Encounter for gynecological examination (general) (routine) with abnormal findings: Secondary | ICD-10-CM | POA: Diagnosis not present

## 2016-04-20 DIAGNOSIS — Z01419 Encounter for gynecological examination (general) (routine) without abnormal findings: Secondary | ICD-10-CM

## 2016-04-20 DIAGNOSIS — Z1151 Encounter for screening for human papillomavirus (HPV): Secondary | ICD-10-CM | POA: Insufficient documentation

## 2016-04-20 DIAGNOSIS — Z8619 Personal history of other infectious and parasitic diseases: Secondary | ICD-10-CM

## 2016-04-20 DIAGNOSIS — Z124 Encounter for screening for malignant neoplasm of cervix: Secondary | ICD-10-CM | POA: Diagnosis not present

## 2016-04-20 DIAGNOSIS — Z8742 Personal history of other diseases of the female genital tract: Secondary | ICD-10-CM

## 2016-04-20 LAB — HEMOCCULT GUIAC POC 1CARD (OFFICE): Fecal Occult Blood, POC: NEGATIVE

## 2016-04-20 NOTE — Addendum Note (Signed)
Addended by: Colen DarlingYOUNG, Hazley Dezeeuw S on: 04/20/2016 10:07 AM   Modules accepted: Orders

## 2016-04-20 NOTE — Progress Notes (Signed)
Patient ID: Kristy Oneal, female   DOB: 10/26/1950, 65 y.o.   MRN: [id number] History of Present Illness:  Kristy Oneal is a 65 year old black female, married, retired this year, in for well woman gyn exam and pap, pap last year +HPV and has history of abnormal pap. PCP is S[facility],[place] at [facility].  Current Medications, Allergies, Past Medical History, Past Surgical History, Family History and Social History were reviewed in [facility] electronic medical record.     Review of Systems: Patient denies any headaches, hearing loss, fatigue, blurred vision, shortness of breath, chest pain, abdominal pain, problems with bowel movements, urination, or intercourse. No joint pain or mood swings.    Physical Exam:BP 136/80 (BP Location: Left Arm, Patient Position: Sitting, Cuff Size: Normal)   Pulse (!) 104   Ht 5\' 1"  (1.549 m)   Wt 118 lb (53.5 kg)   BMI 22.30 kg/m  General:  Well developed, well nourished, no acute distress Skin:  Warm and dry Neck:  Midline trachea, normal thyroid, good ROM, no lymphadenopathy,no carotid  bruits heard Lungs; Clear to auscultation bilaterally Breast:  No dominant palpable mass, retraction, or nipple discharge Cardiovascular: Regular rate and rhythm Abdomen:  Soft, non tender, no hepatosplenomegaly Pelvic:  External genitalia is normal in appearance, no lesions.  The vagina is normal in appearance. Urethra has no lesions or masses. The cervix is smooth and stenotic at os, pap with HPV performed.  Uterus is felt to be normal size, shape, and contour.  No adnexal masses or tenderness noted.Bladder is non tender, no masses felt. Rectal: Good sphincter tone, no polyps, or hemorrhoids felt.  Hemoccult negative. Extremities/musculoskeletal:  No swelling or varicosities noted, no clubbing or cyanosis Psych:  No mood changes, alert and cooperative,seems happy PHQ 2 score 0  Impression:  1. Encounter for routine gynecological examination with Papanicolaou smear of  cervix   2. History of HPV infection   3. History of abnormal cervical Pap smear      Plan:   Physical in 1 year, pap in 3 if normal Mammogram yearly Colonoscopy per GI Labs per PCP

## 2016-04-20 NOTE — Patient Instructions (Signed)
Physical in 1 year, pap in 3 if normal Mammogram yearly Colonoscopy per GI Labs per PCP

## 2016-04-25 LAB — CYTOLOGY - PAP
DIAGNOSIS: NEGATIVE
HPV (WINDOPATH): DETECTED — AB

## 2016-05-25 DIAGNOSIS — Z23 Encounter for immunization: Secondary | ICD-10-CM | POA: Diagnosis not present

## 2016-06-06 DIAGNOSIS — N179 Acute kidney failure, unspecified: Secondary | ICD-10-CM | POA: Diagnosis not present

## 2016-06-06 DIAGNOSIS — I10 Essential (primary) hypertension: Secondary | ICD-10-CM | POA: Diagnosis not present

## 2016-06-06 DIAGNOSIS — N183 Chronic kidney disease, stage 3 (moderate): Secondary | ICD-10-CM | POA: Diagnosis not present

## 2016-06-11 ENCOUNTER — Other Ambulatory Visit (HOSPITAL_COMMUNITY): Payer: Self-pay | Admitting: Nephrology

## 2016-06-11 DIAGNOSIS — N179 Acute kidney failure, unspecified: Secondary | ICD-10-CM

## 2016-06-11 DIAGNOSIS — N183 Chronic kidney disease, stage 3 unspecified: Secondary | ICD-10-CM

## 2016-06-14 ENCOUNTER — Ambulatory Visit (HOSPITAL_COMMUNITY)
Admission: RE | Admit: 2016-06-14 | Discharge: 2016-06-14 | Disposition: A | Discharge status: Home / Self Care | Payer: Medicare Other | Source: Ambulatory Visit | Attending: Nephrology | Admitting: Nephrology

## 2016-06-14 DIAGNOSIS — N183 Chronic kidney disease, stage 3 unspecified: Secondary | ICD-10-CM

## 2016-06-14 DIAGNOSIS — N179 Acute kidney failure, unspecified: Secondary | ICD-10-CM | POA: Diagnosis not present

## 2016-06-14 DIAGNOSIS — N281 Cyst of kidney, acquired: Secondary | ICD-10-CM | POA: Insufficient documentation

## 2016-06-27 DIAGNOSIS — R809 Proteinuria, unspecified: Secondary | ICD-10-CM | POA: Diagnosis not present

## 2016-06-27 DIAGNOSIS — N179 Acute kidney failure, unspecified: Secondary | ICD-10-CM | POA: Diagnosis not present

## 2016-06-27 DIAGNOSIS — I10 Essential (primary) hypertension: Secondary | ICD-10-CM | POA: Diagnosis not present

## 2016-08-27 DIAGNOSIS — N183 Chronic kidney disease, stage 3 (moderate): Secondary | ICD-10-CM | POA: Diagnosis not present

## 2016-08-27 DIAGNOSIS — N179 Acute kidney failure, unspecified: Secondary | ICD-10-CM | POA: Diagnosis not present

## 2016-08-27 DIAGNOSIS — I10 Essential (primary) hypertension: Secondary | ICD-10-CM | POA: Diagnosis not present

## 2016-08-29 DIAGNOSIS — N179 Acute kidney failure, unspecified: Secondary | ICD-10-CM | POA: Diagnosis not present

## 2016-08-29 DIAGNOSIS — R809 Proteinuria, unspecified: Secondary | ICD-10-CM | POA: Diagnosis not present

## 2016-08-29 DIAGNOSIS — I10 Essential (primary) hypertension: Secondary | ICD-10-CM | POA: Diagnosis not present

## 2017-01-01 ENCOUNTER — Telehealth: Payer: Self-pay | Admitting: Gastroenterology

## 2017-01-01 NOTE — Telephone Encounter (Signed)
Letter mailed to pt.  

## 2017-01-01 NOTE — Telephone Encounter (Signed)
Aug recall for tcs °

## 2017-01-14 NOTE — Telephone Encounter (Signed)
Pt is calling to set up TCS

## 2017-01-16 NOTE — Telephone Encounter (Signed)
Gastroenterology Pre-Procedure Review  Request Date: [facility] Physician: [facility] [place], [place]  PATIENT REVIEW QUESTIONS: The patient responded to the following health history questions as indicated:    Last colonoscopy 02/05/2007  1. Diabetes Melitis: no 2. Joint replacements in the past 12 months: no 3. Major health problems in the past 3 months: no 4. Has an artificial valve or MVP: no 5. Has a defibrillator: no 6. Has been advised in past to take antibiotics in advance of a procedure like teeth cleaning: no 7. Family history of colon cancer: no  8. Alcohol Use: no 9. History of sleep apnea: no  10. History of coronary artery or other vascular stents placed within the last 12 months: no    MEDICATIONS & ALLERGIES:    Patient reports the following regarding taking any blood thinners:   Plavix? no Aspirin? YES Coumadin? no Brilinta? no Xarelto? no Eliquis? no Pradaxa? no Savaysa? no Effient? no  Patient confirms/reports the following medications:  Current Outpatient Prescriptions  Medication Sig Dispense Refill  . amLODipine (NORVASC) 10 MG tablet Take 10 mg by mouth daily.    Marland Kitchen. aspirin 81 MG tablet Take 81 mg by mouth daily.    . calcium gluconate 500 MG tablet Take 500 mg by mouth daily.    . Cyanocobalamin (VITAMIN B 12 PO) Take 1 tablet by mouth daily.    . diphenhydrAMINE (BENADRYL) 25 MG tablet Take 25 mg by mouth daily.    . multivitamin-iron-minerals-folic acid (CENTRUM) chewable tablet Chew 1 tablet by mouth daily.     No current facility-administered medications for this visit.     Patient confirms/reports the following allergies:  No Known Allergies  No orders of the defined types were placed in this encounter.   AUTHORIZATION INFORMATION Primary Insurance: Medicare,  ID #: [id number],  Group #:   Secondary Insurance: [place]  [place] [id number],  Group #:   SCHEDULE INFORMATION: Procedure has been scheduled as follows:  Date: ,  Time:   Location:   This Gastroenterology Pre-Precedure Review Form is being routed to the following provider(s): Jonette EvaSandi Fields, MD

## 2017-01-16 NOTE — Telephone Encounter (Signed)
Forwarding to [facility]. 

## 2017-01-16 NOTE — Telephone Encounter (Signed)
Tried to call pt, no answer, no answering machine. 

## 2017-01-23 NOTE — Telephone Encounter (Signed)

## 2017-01-23 NOTE — Telephone Encounter (Signed)
Tried to call pt, no answer, LMOAM for her to call office. 

## 2017-01-23 NOTE — Telephone Encounter (Signed)
Forwarding to Martina who triaged pt.  

## 2017-01-24 ENCOUNTER — Other Ambulatory Visit: Payer: Self-pay

## 2017-01-24 DIAGNOSIS — Z1211 Encounter for screening for malignant neoplasm of colon: Secondary | ICD-10-CM

## 2017-01-24 MED ORDER — NA SULFATE-K SULFATE-MG SULF 17.5-3.13-1.6 GM/177ML PO SOLN: 1.0000 | ORAL | 0 refills | Status: DC

## 2017-01-24 NOTE — Telephone Encounter (Signed)
Called pt. Colonoscopy with [facility] scheduled for 02/06/17 at 2:00pm. Rx for Suprep sent to pharmacy. Instructions mailed. Full liquid diet printout included with instructions. Orders entered.

## 2017-02-06 ENCOUNTER — Ambulatory Visit (HOSPITAL_COMMUNITY)
Admission: RE | Admit: 2017-02-06 | Discharge: 2017-02-06 | Disposition: A | Discharge status: Home / Self Care | Payer: Medicare Other | Source: Ambulatory Visit | Attending: Gastroenterology | Admitting: Gastroenterology

## 2017-02-06 ENCOUNTER — Encounter (HOSPITAL_COMMUNITY)
Admission: RE | Disposition: A | Discharge status: Home / Self Care | Payer: Self-pay | Source: Ambulatory Visit | Attending: Gastroenterology

## 2017-02-06 ENCOUNTER — Encounter (HOSPITAL_COMMUNITY): Payer: Self-pay

## 2017-02-06 DIAGNOSIS — Z1211 Encounter for screening for malignant neoplasm of colon: Secondary | ICD-10-CM | POA: Diagnosis not present

## 2017-02-06 DIAGNOSIS — Q438 Other specified congenital malformations of intestine: Secondary | ICD-10-CM | POA: Diagnosis not present

## 2017-02-06 DIAGNOSIS — K648 Other hemorrhoids: Secondary | ICD-10-CM | POA: Diagnosis not present

## 2017-02-06 DIAGNOSIS — Z79899 Other long term (current) drug therapy: Secondary | ICD-10-CM | POA: Insufficient documentation

## 2017-02-06 DIAGNOSIS — I10 Essential (primary) hypertension: Secondary | ICD-10-CM | POA: Insufficient documentation

## 2017-02-06 DIAGNOSIS — Z7982 Long term (current) use of aspirin: Secondary | ICD-10-CM | POA: Diagnosis not present

## 2017-02-06 DIAGNOSIS — Z1212 Encounter for screening for malignant neoplasm of rectum: Secondary | ICD-10-CM

## 2017-02-06 HISTORY — PX: COLONOSCOPY: SHX5424

## 2017-02-06 SURGERY — COLONOSCOPY
Anesthesia: Moderate Sedation

## 2017-02-06 MED ORDER — MIDAZOLAM HCL 5 MG/5ML IJ SOLN
INTRAMUSCULAR | Status: DC | PRN
  Administered 2017-02-06: 1 mg via INTRAVENOUS
  Administered 2017-02-06: 2 mg via INTRAVENOUS

## 2017-02-06 MED ORDER — MIDAZOLAM HCL 5 MG/5ML IJ SOLN
INTRAMUSCULAR | Status: DC
Start: 2017-02-06 — End: 2017-02-06
  Filled 2017-02-06: qty 10

## 2017-02-06 MED ORDER — MEPERIDINE HCL 100 MG/ML IJ SOLN
INTRAMUSCULAR | Status: AC
  Filled 2017-02-06: qty 2

## 2017-02-06 MED ORDER — MEPERIDINE HCL 100 MG/ML IJ SOLN
INTRAMUSCULAR | Status: DC | PRN
  Administered 2017-02-06 (×2): 25 mg via INTRAVENOUS

## 2017-02-06 MED ORDER — SODIUM CHLORIDE 0.9 % IV SOLN
INTRAVENOUS | Status: DC
  Administered 2017-02-06: 14:00:00 via INTRAVENOUS

## 2017-02-06 NOTE — Discharge Instructions (Signed)
You have internal hemorrhoids. YOU DID NOT HAVE ANY POLYPS. ° ° °DRINK WATER TO KEEP YOUR URINE LIGHT YELLOW. ° °FOLLOW A HIGH FIBER DIET. AVOID ITEMS THAT CAUSE BLOATING. SEE INFO BELOW. ° °USE PREPARATION H FOUR TIMES  A DAY IF NEEDED TO RELIEVE RECTAL PAIN/PRESSURE/BLEEDING. ° °Next colonoscopy in 10 years. °Colonoscopy °Care After °Read the instructions outlined below and refer to this sheet in the next week. These discharge instructions provide you with general information on caring for yourself after you leave the hospital. While your treatment has been planned according to the most current medical practices available, unavoidable complications occasionally occur. If you have any problems or questions after discharge, call DR. Roselyne Stalnaker, [phone]. ° °ACTIVITY °· You may resume your regular activity, but move at a slower pace for the next 24 hours.  °· Take frequent rest periods for the next 24 hours.  °· Walking will help get rid of the air and reduce the bloated feeling in your belly (abdomen).  °· No driving for 24 hours (because of the medicine (anesthesia) used during the test).  °· You may shower.  °· Do not sign any important legal documents or operate any machinery for 24 hours (because of the anesthesia used during the test).  °·  °NUTRITION °· Drink plenty of fluids.  °· You may resume your normal diet as instructed by your doctor.  °· Begin with a light meal and progress to your normal diet. Heavy or fried foods are harder to digest and may make you feel sick to your stomach (nauseated).  °· Avoid alcoholic beverages for 24 hours or as instructed.  °·  °MEDICATIONS °· You may resume your normal medications. °·  °WHAT YOU CAN EXPECT TODAY °· Some feelings of bloating in the abdomen.  °· Passage of more gas than usual.  °· Spotting of blood in your stool or on the toilet paper °· .  °IF YOU HAD POLYPS REMOVED DURING THE COLONOSCOPY: °· Eat a soft diet IF YOU HAVE NAUSEA, BLOATING, ABDOMINAL PAIN, OR  VOMITING. °·   °FINDING OUT THE RESULTS OF YOUR TEST °Not all test results are available during your visit. DR. Kieran Arreguin WILL CALL YOU WITHIN 7 DAYS OF YOUR PROCEDUE WITH YOUR RESULTS. Do not assume everything is normal if you have not heard from DR. Lary Eckardt IN ONE WEEK, CALL HER OFFICE AT [phone]. ° °SEEK IMMEDIATE MEDICAL ATTENTION AND CALL THE OFFICE: [phone] IF: °· You have more than a spotting of blood in your stool.  °· Your belly is swollen (abdominal distention).  °· You are nauseated or vomiting.  °· You have a temperature over 101F.  °· You have abdominal pain or discomfort that is severe or gets worse throughout the day. ° °High-Fiber Diet °A high-fiber diet changes your normal diet to include more whole grains, legumes, fruits, and vegetables. Changes in the diet involve replacing refined carbohydrates with unrefined foods. The calorie level of the diet is essentially unchanged. The Dietary Reference Intake (recommended amount) for adult males is 38 grams per day. For adult females, it is 25 grams per day. Pregnant and lactating women should consume 28 grams of fiber per day. °Fiber is the intact part of a plant that is not broken down during digestion. Functional fiber is fiber that has been isolated from the plant to provide a beneficial effect in the body. °PURPOSE °· Increase stool bulk.  °· Ease and regulate bowel movements.  °· Lower cholesterol.  °· REDUCE RISK OF COLON   CANCER ° °INDICATIONS THAT YOU NEED MORE FIBER °· Constipation and hemorrhoids.  °· Uncomplicated diverticulosis (intestine condition) and irritable bowel syndrome.  °· Weight management.  °· As a protective measure against hardening of the arteries (atherosclerosis), diabetes, and cancer.  ° °GUIDELINES FOR INCREASING FIBER IN THE DIET °· Start adding fiber to the diet slowly. A gradual increase of about 5 more grams (2 slices of whole-wheat bread, 2 servings of most fruits or vegetables, or 1 bowl of high-fiber cereal) per  day is best. Too rapid an increase in fiber may result in constipation, flatulence, and bloating.  °· Drink enough water and fluids to keep your urine clear or pale yellow. Water, juice, or caffeine-free drinks are recommended. Not drinking enough fluid may cause constipation.  °· Eat a variety of high-fiber foods rather than one type of fiber.  °· Try to increase your intake of fiber through using high-fiber foods rather than fiber pills or supplements that contain small amounts of fiber.  °· The goal is to change the types of food eaten. Do not supplement your present diet with high-fiber foods, but replace foods in your present diet.  ° °INCLUDE A VARIETY OF FIBER SOURCES °· Replace refined and processed grains with whole grains, canned fruits with fresh fruits, and incorporate other fiber sources. White rice, white breads, and most bakery goods contain little or no fiber.  °· Brown whole-grain rice, buckwheat oats, and many fruits and vegetables are all good sources of fiber. These include: broccoli, Brussels sprouts, cabbage, cauliflower, beets, sweet potatoes, white potatoes (skin on), carrots, tomatoes, eggplant, squash, berries, fresh fruits, and dried fruits.  °· Cereals appear to be the richest source of fiber. Cereal fiber is found in whole grains and bran. Bran is the fiber-rich outer coat of cereal grain, which is largely removed in refining. In whole-grain cereals, the bran remains. In breakfast cereals, the largest amount of fiber is found in those with "bran" in their names. The fiber content is sometimes indicated on the label.  °· You may need to include additional fruits and vegetables each day.  °· In baking, for 1 cup white flour, you may use the following substitutions:  °· 1 cup whole-wheat flour minus 2 tablespoons.  °· 1/2 cup white flour plus 1/2 cup whole-wheat flour.  ° °Hemorrhoids °Hemorrhoids are dilated (enlarged) veins around the rectum. Sometimes clots will form in the veins. This  makes them swollen and painful. These are called thrombosed hemorrhoids. °Causes of hemorrhoids include: °· Constipation.  °· Straining to have a bowel movement. °·  HEAVY LIFTING ° °HOME CARE INSTRUCTIONS °· Eat a well balanced diet and drink 6 to 8 glasses of water every day to avoid constipation. You may also use a bulk laxative.  °· Avoid straining to have bowel movements.  °· Keep anal area dry and clean.  °· Do not use a donut shaped pillow or sit on the toilet for long periods. This increases blood pooling and pain.  °· Move your bowels when your body has the urge; this will require less straining and will decrease pain and pressure.  ° °

## 2017-02-06 NOTE — H&P (Signed)
Primary Care Physician:  Scherrie Bateman Primary Gastroenterologist:  Dr. Oneida Alar  Pre-Procedure History & Physical: HPI:  Kristy Oneal is a 66 y.o. female here for [place].  Past Medical History:  Diagnosis Date  . Abnormal Pap smear   . History of abnormal cervical Pap smear 03/22/2014  . Hypertension   . Rectocele, female 03/22/2014  . Vaginal Pap smear, abnormal     Past Surgical History:  Procedure Laterality Date  . BREAST SURGERY     fibroid removed  . ENDOMETRIAL ABLATION    . tubal liagtion    . TUBAL LIGATION      Prior to Admission medications   Medication Sig Start Date End Date Taking? Authorizing Provider  amLODipine (NORVASC) 10 MG tablet Take 10 mg by mouth daily.   Yes [provider]  aspirin 81 MG tablet Take 81 mg by mouth daily.   Yes [provider]  Calcium Carbonate-Vitamin D (CALCIUM 600+D PO) Take 1 tablet by mouth daily.   Yes [provider]  diphenhydrAMINE (BENADRYL) 25 MG tablet Take 25 mg by mouth daily.   Yes [provider]  Multiple Vitamins-Minerals (CENTRUM ULTRA WOMENS) TABS Take 1 tablet by mouth daily.   Yes [provider]  Na Sulfate-K Sulfate-Mg Sulf (SUPREP BOWEL PREP KIT) 17.5-3.13-1.6 GM/180ML SOLN Take 1 kit by mouth as directed. 01/24/17  Yes Megon Kalina, Marga Melnick, MD  vitamin B-12 (CYANOCOBALAMIN) 500 MCG tablet Take 500 mcg by mouth daily.   Yes [provider]    Allergies as of 01/24/2017  . (No Known Allergies)    Family History  Problem Relation Age of Onset  . Cancer Mother        liver    Social History   Social History  . Marital status: Married    Spouse name: N/A  . Number of children: N/A  . Years of education: N/A   Occupational History  . Not on file.   Social History Main Topics  . Smoking status: Never Smoker  . Smokeless tobacco: Never Used  . Alcohol use No  . Drug use: No  . Sexual activity: Yes    Birth control/  protection: Surgical     Comment: tubal and ablation   Other Topics Concern  . Not on file   Social History Narrative  . No narrative on file    Review of Systems: See HPI, otherwise negative ROS   Physical Exam: BP 131/84   Pulse 87   Temp 98.4 F (36.9 C) (Oral)   Resp 13   Ht 5' 2"  (1.575 m)   Wt 116 lb (52.6 kg)   SpO2 97%   BMI 21.22 kg/m  General:   Alert,  pleasant and cooperative in NAD Head:  Normocephalic and atraumatic. Neck:  Supple; Lungs:  Clear throughout to auscultation.    Heart:  Regular rate and rhythm. Abdomen:  Soft, nontender and nondistended. Normal bowel sounds, without guarding, and without rebound.   Neurologic:  Alert and  oriented x4;  grossly normal neurologically.  Impression/Plan:     SCREENING  Plan:  1. TCS TODAY. DISCUSSED PROCEDURE, BENEFITS, & RISKS: < 1% chance of medication reaction, bleeding, perforation, or rupture of spleen/liver.

## 2017-02-06 NOTE — Op Note (Signed)
[facility] Patient Name: Kristy Oneal Procedure Date: 02/06/2017 1:50 PM MRN: [id number] Date of Birth: 1951/03/25 Attending MD: Jonette EvaSandi Birdell Frasier , MD CSN: [id number] Age: 1966 Admit Type: Outpatient Procedure:                Colonoscopy, SCREENING Indications:              Screening for colorectal malignant neoplasm Providers:                Jonette EvaSandi Rio Taber, MD, Nena PolioLisa Moore, RN, Burke Keelsrisann Tilley,                            Technician Referring MD:              Medicines:                Meperidine 50 mg IV, Midazolam 3 mg IV Complications:            No immediate complications. Estimated Blood Loss:     Estimated blood loss: none. Procedure:                Pre-Anesthesia Assessment:                           - Prior to the procedure, a History and Physical                            was performed, and patient medications and                            allergies were reviewed. The patient's tolerance of                            previous anesthesia was also reviewed. The risks                            and benefits of the procedure and the sedation                            options and risks were discussed with the patient.                            All questions were answered, and informed consent                            was obtained. Prior Anticoagulants: The patient has                            taken aspirin, last dose was 1 day prior to                            procedure. ASA Grade Assessment: II - A patient                            with mild systemic disease. After reviewing the  risks and benefits, the patient was deemed in                            satisfactory condition to undergo the procedure.                            After obtaining informed consent, the colonoscope                            was passed under direct vision. Throughout the                            procedure, the patient's blood pressure, pulse, and                             oxygen saturations were monitored continuously. The                            EC-[id number] ([id number] scope was introduced through                            the anus and advanced to the the cecum, identified                            by appendiceal orifice and ileocecal valve. The                            patient tolerated the procedure well. The quality                            of the bowel preparation was excellent. The                            ileocecal valve, appendiceal orifice, and rectum                            were photographed. The colonoscopy was somewhat                            difficult due to a tortuous colon. Successful                            completion of the procedure was aided by COLOWRAP. Scope In: 2:26:08 PM Scope Out: 2:40:15 PM Scope Withdrawal Time: 0 hours 11 minutes 52 seconds  Total Procedure Duration: 0 hours 14 minutes 7 seconds  Findings:      The recto-sigmoid colon and sigmoid colon were moderately redundant.      The exam was otherwise normal throughout the examined colon.      Internal hemorrhoids were found during retroflexion. The hemorrhoids       were small. Impression:               - Redundant colon.                           -  Internal hemorrhoids. Moderate Sedation:      Moderate (conscious) sedation was administered by the endoscopy nurse       and supervised by the endoscopist. The following parameters were       monitored: oxygen saturation, heart rate, blood pressure, and response       to care. Total physician intraservice time was 25 minutes. Recommendation:           - Repeat colonoscopy in 10 years for surveillance.                           - High fiber diet.                           - Continue present medications.                           - Patient has a contact number available for                            emergencies. The signs and symptoms of potential                            delayed complications were  discussed with the                            patient. Return to normal activities tomorrow.                            Written discharge instructions were provided to the                            patient. Procedure Code(s):        --- Professional ---                           [phone], Colonoscopy, flexible; diagnostic, including                            collection of specimen(s) by brushing or washing,                            when performed (separate procedure)                           99152, Moderate sedation services provided by the                            same physician or other qualified health care                            professional performing the diagnostic or                            therapeutic service that the sedation supports,  requiring the presence of an independent trained                            observer to assist in the monitoring of the                            patient's level of consciousness and physiological                            status; initial 15 minutes of intraservice time,                            patient age 63 years or older                           [phone], Moderate sedation services; each additional                            15 minutes intraservice time Diagnosis Code(s):        --- Professional ---                           Z12.11, Encounter for screening for malignant                            neoplasm of colon                           K64.8, Other hemorrhoids                           Q43.8, Other specified congenital malformations of                            intestine CPT copyright 2016 [employer]. All rights reserved. The codes documented in this report are preliminary and upon coder review may  be revised to meet current compliance requirements. Jonette Eva, MD Jonette Eva, MD 02/06/2017 3:01:21 PM This report has been signed electronically. Number of Addenda: 0

## 2017-02-08 ENCOUNTER — Encounter (HOSPITAL_COMMUNITY): Payer: Self-pay | Admitting: Gastroenterology

## 2017-03-21 DIAGNOSIS — I10 Essential (primary) hypertension: Secondary | ICD-10-CM | POA: Diagnosis not present

## 2017-03-21 DIAGNOSIS — Z1389 Encounter for screening for other disorder: Secondary | ICD-10-CM | POA: Diagnosis not present

## 2017-03-21 DIAGNOSIS — Z23 Encounter for immunization: Secondary | ICD-10-CM | POA: Diagnosis not present

## 2017-03-21 DIAGNOSIS — Z6821 Body mass index (BMI) 21.0-21.9, adult: Secondary | ICD-10-CM | POA: Diagnosis not present

## 2017-03-26 DIAGNOSIS — E785 Hyperlipidemia, unspecified: Secondary | ICD-10-CM | POA: Diagnosis not present

## 2017-03-26 DIAGNOSIS — Z0000 Encounter for general adult medical examination without abnormal findings: Secondary | ICD-10-CM | POA: Diagnosis not present

## 2017-03-26 DIAGNOSIS — E039 Hypothyroidism, unspecified: Secondary | ICD-10-CM | POA: Diagnosis not present

## 2017-04-11 DIAGNOSIS — Z1231 Encounter for screening mammogram for malignant neoplasm of breast: Secondary | ICD-10-CM | POA: Diagnosis not present

## 2018-04-14 DIAGNOSIS — Z1231 Encounter for screening mammogram for malignant neoplasm of breast: Secondary | ICD-10-CM | POA: Diagnosis not present

## 2018-05-16 DIAGNOSIS — Z6821 Body mass index (BMI) 21.0-21.9, adult: Secondary | ICD-10-CM | POA: Diagnosis not present

## 2018-05-16 DIAGNOSIS — M25562 Pain in left knee: Secondary | ICD-10-CM | POA: Diagnosis not present

## 2018-05-16 DIAGNOSIS — Z0001 Encounter for general adult medical examination with abnormal findings: Secondary | ICD-10-CM | POA: Diagnosis not present

## 2018-05-16 DIAGNOSIS — Z23 Encounter for immunization: Secondary | ICD-10-CM | POA: Diagnosis not present

## 2018-05-16 DIAGNOSIS — I10 Essential (primary) hypertension: Secondary | ICD-10-CM | POA: Diagnosis not present

## 2018-06-04 DIAGNOSIS — I10 Essential (primary) hypertension: Secondary | ICD-10-CM | POA: Diagnosis not present

## 2018-06-04 DIAGNOSIS — Z0000 Encounter for general adult medical examination without abnormal findings: Secondary | ICD-10-CM | POA: Diagnosis not present

## 2018-12-25 DIAGNOSIS — Z6821 Body mass index (BMI) 21.0-21.9, adult: Secondary | ICD-10-CM | POA: Diagnosis not present

## 2018-12-25 DIAGNOSIS — Z1389 Encounter for screening for other disorder: Secondary | ICD-10-CM | POA: Diagnosis not present

## 2018-12-25 DIAGNOSIS — I10 Essential (primary) hypertension: Secondary | ICD-10-CM | POA: Diagnosis not present

## 2019-04-20 DIAGNOSIS — Z1231 Encounter for screening mammogram for malignant neoplasm of breast: Secondary | ICD-10-CM | POA: Diagnosis not present

## 2019-04-24 DIAGNOSIS — R928 Other abnormal and inconclusive findings on diagnostic imaging of breast: Secondary | ICD-10-CM | POA: Diagnosis not present

## 2019-06-18 DIAGNOSIS — Z0000 Encounter for general adult medical examination without abnormal findings: Secondary | ICD-10-CM | POA: Diagnosis not present

## 2019-06-18 DIAGNOSIS — Z6822 Body mass index (BMI) 22.0-22.9, adult: Secondary | ICD-10-CM | POA: Diagnosis not present

## 2019-06-18 DIAGNOSIS — Z1389 Encounter for screening for other disorder: Secondary | ICD-10-CM | POA: Diagnosis not present

## 2019-06-18 DIAGNOSIS — Z23 Encounter for immunization: Secondary | ICD-10-CM | POA: Diagnosis not present

## 2019-09-04 DIAGNOSIS — Z23 Encounter for immunization: Secondary | ICD-10-CM | POA: Diagnosis not present

## 2019-09-24 DIAGNOSIS — Z6822 Body mass index (BMI) 22.0-22.9, adult: Secondary | ICD-10-CM | POA: Diagnosis not present

## 2019-09-24 DIAGNOSIS — M67431 Ganglion, right wrist: Secondary | ICD-10-CM | POA: Diagnosis not present

## 2019-10-02 DIAGNOSIS — Z23 Encounter for immunization: Secondary | ICD-10-CM | POA: Diagnosis not present

## 2019-10-09 DIAGNOSIS — M67431 Ganglion, right wrist: Secondary | ICD-10-CM | POA: Diagnosis not present

## 2019-12-03 ENCOUNTER — Emergency Department (HOSPITAL_COMMUNITY)
Admission: EM | Admit: 2019-12-03 | Discharge: 2019-12-03 | Disposition: A | Discharge status: Home / Self Care | Payer: Medicare Other | Attending: Emergency Medicine | Admitting: Emergency Medicine

## 2019-12-03 ENCOUNTER — Emergency Department (HOSPITAL_COMMUNITY): Payer: Medicare Other

## 2019-12-03 ENCOUNTER — Encounter (HOSPITAL_COMMUNITY): Payer: Self-pay | Admitting: Emergency Medicine

## 2019-12-03 ENCOUNTER — Other Ambulatory Visit: Payer: Self-pay

## 2019-12-03 DIAGNOSIS — I10 Essential (primary) hypertension: Secondary | ICD-10-CM | POA: Diagnosis not present

## 2019-12-03 DIAGNOSIS — R1110 Vomiting, unspecified: Secondary | ICD-10-CM

## 2019-12-03 DIAGNOSIS — R531 Weakness: Secondary | ICD-10-CM | POA: Diagnosis not present

## 2019-12-03 DIAGNOSIS — Z79899 Other long term (current) drug therapy: Secondary | ICD-10-CM | POA: Insufficient documentation

## 2019-12-03 DIAGNOSIS — E876 Hypokalemia: Secondary | ICD-10-CM

## 2019-12-03 DIAGNOSIS — R110 Nausea: Secondary | ICD-10-CM | POA: Diagnosis not present

## 2019-12-03 DIAGNOSIS — R1111 Vomiting without nausea: Secondary | ICD-10-CM | POA: Diagnosis not present

## 2019-12-03 DIAGNOSIS — R5381 Other malaise: Secondary | ICD-10-CM | POA: Diagnosis not present

## 2019-12-03 DIAGNOSIS — R112 Nausea with vomiting, unspecified: Secondary | ICD-10-CM | POA: Insufficient documentation

## 2019-12-03 DIAGNOSIS — R42 Dizziness and giddiness: Secondary | ICD-10-CM | POA: Diagnosis not present

## 2019-12-03 LAB — CBC WITH DIFFERENTIAL/PLATELET
Abs Immature Granulocytes: 0.06 10*3/uL (ref 0.00–0.07)
Basophils Absolute: 0.1 10*3/uL (ref 0.0–0.1)
Basophils Relative: 1 %
Eosinophils Absolute: 0.1 10*3/uL (ref 0.0–0.5)
Eosinophils Relative: 1 %
HCT: 42.3 % (ref 36.0–46.0)
Hemoglobin: 13.6 g/dL (ref 12.0–15.0)
Immature Granulocytes: 1 %
Lymphocytes Relative: 16 %
Lymphs Abs: 1.8 10*3/uL (ref 0.7–4.0)
MCH: 27.5 pg (ref 26.0–34.0)
MCHC: 32.2 g/dL (ref 30.0–36.0)
MCV: 85.6 fL (ref 80.0–100.0)
Monocytes Absolute: 0.5 10*3/uL (ref 0.1–1.0)
Monocytes Relative: 5 %
Neutro Abs: 8.5 10*3/uL — ABNORMAL HIGH (ref 1.7–7.7)
Neutrophils Relative %: 76 %
Platelets: 299 10*3/uL (ref 150–400)
RBC: 4.94 MIL/uL (ref 3.87–5.11)
RDW: 13.2 % (ref 11.5–15.5)
WBC: 11 10*3/uL — ABNORMAL HIGH (ref 4.0–10.5)
nRBC: 0 % (ref 0.0–0.2)

## 2019-12-03 LAB — COMPREHENSIVE METABOLIC PANEL
ALT: 26 U/L (ref 0–44)
AST: 27 U/L (ref 15–41)
Albumin: 4.3 g/dL (ref 3.5–5.0)
Alkaline Phosphatase: 106 U/L (ref 38–126)
Anion gap: 11 (ref 5–15)
BUN: 16 mg/dL (ref 8–23)
CO2: 28 mmol/L (ref 22–32)
Calcium: 9.5 mg/dL (ref 8.9–10.3)
Chloride: 102 mmol/L (ref 98–111)
Creatinine, Ser: 0.78 mg/dL (ref 0.44–1.00)
GFR calc Af Amer: >60 mL/min (ref >60)
GFR calc non Af Amer: >60 mL/min (ref >60)
Glucose, Bld: 192 mg/dL — ABNORMAL HIGH (ref 70–99)
Potassium: 3 mmol/L — ABNORMAL LOW (ref 3.5–5.1)
Sodium: 141 mmol/L (ref 135–145)
Total Bilirubin: 0.5 mg/dL (ref 0.3–1.2)
Total Protein: 8.1 g/dL (ref 6.5–8.1)

## 2019-12-03 LAB — URINALYSIS, ROUTINE W REFLEX MICROSCOPIC
Bacteria, UA: NONE SEEN
Bilirubin Urine: NEGATIVE
Glucose, UA: >=500 mg/dL — AB
Hgb urine dipstick: NEGATIVE
Ketones, ur: NEGATIVE mg/dL
Leukocytes,Ua: NEGATIVE
Nitrite: NEGATIVE
Protein, ur: 30 mg/dL — AB
Specific Gravity, Urine: 1.003 — ABNORMAL LOW (ref 1.005–1.030)
pH: 9 — ABNORMAL HIGH (ref 5.0–8.0)

## 2019-12-03 LAB — LIPASE, BLOOD: Lipase: 154 U/L — ABNORMAL HIGH (ref 11–51)

## 2019-12-03 LAB — MAGNESIUM: Magnesium: 2.1 mg/dL (ref 1.7–2.4)

## 2019-12-03 IMAGING — MR MR MRA HEAD W/O CM
1 series · 16 of 48 positions shown · non-contrast
Comparison: None.

CLINICAL DATA: Generalized weakness with nausea and vomiting

EXAM:
MRA HEAD WITHOUT CONTRAST
TECHNIQUE: Angiographic images of the Circle of Willis were obtained using MRA
technique without intravenous contrast.

[Series 1: TOF fat-sat · axial · 0.8mm · 0.38mm/px · z∈[-172,-74]mm · 16 of 131 slices shown]
[im 1/131]
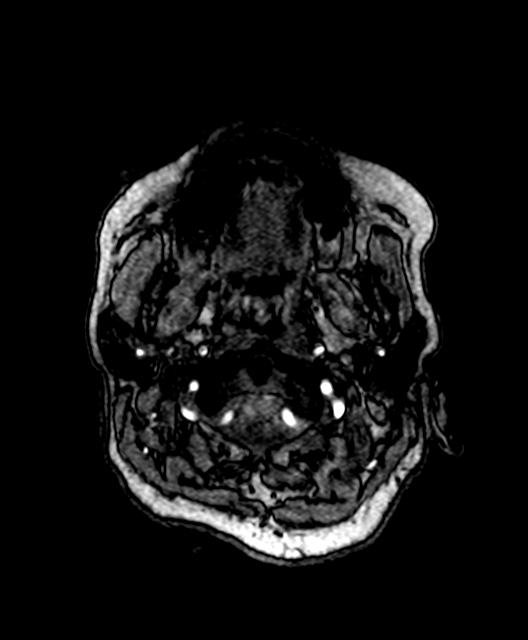
[im 3/131]
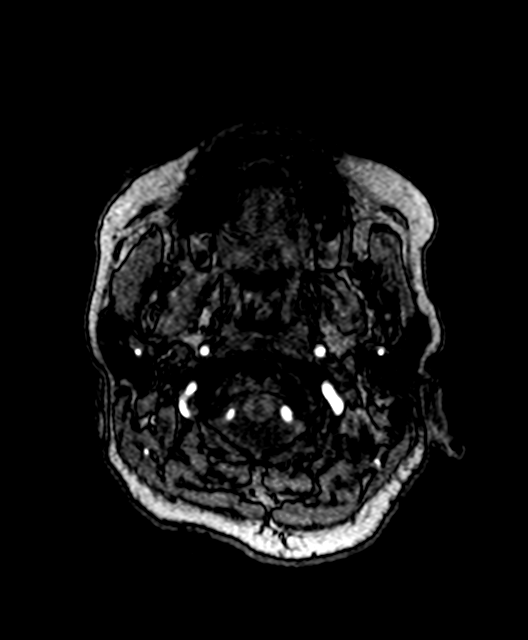
[im 6/131]
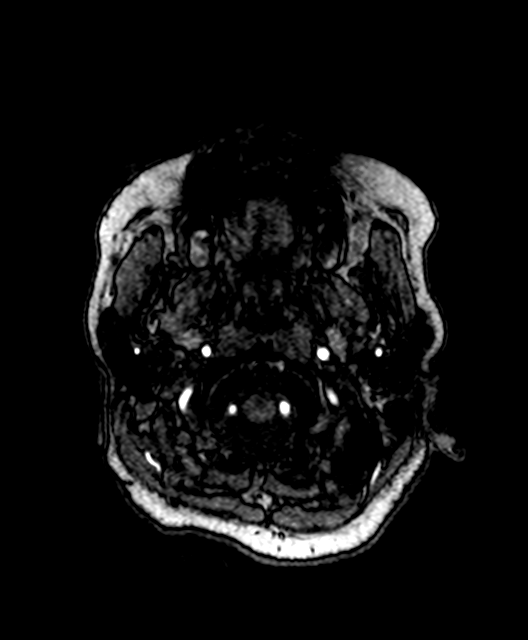
[im 9/131]
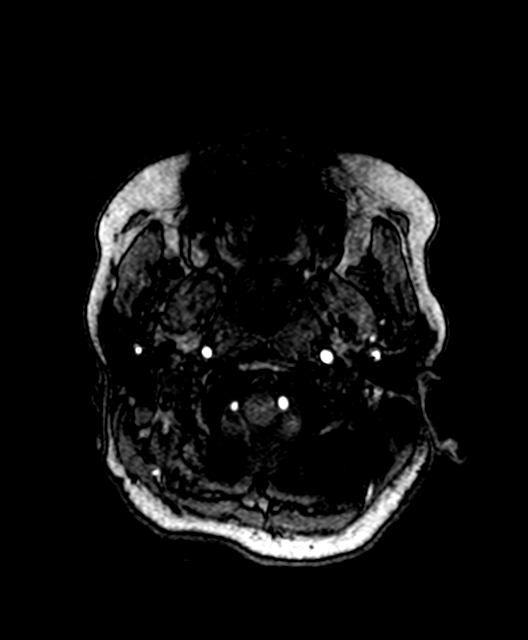
[im 12/131]
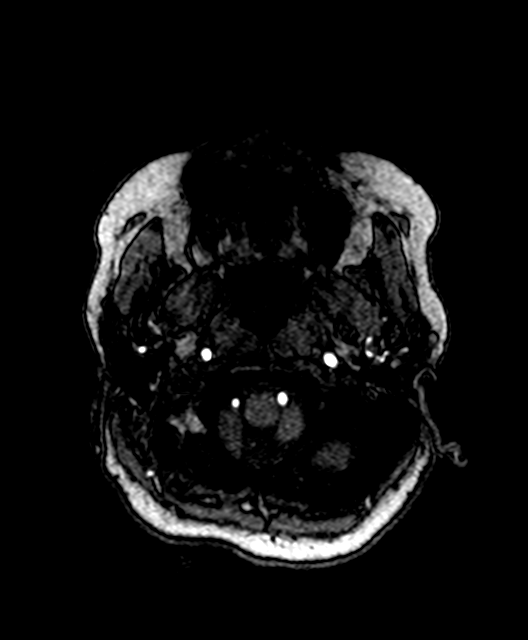
[im 14/131]
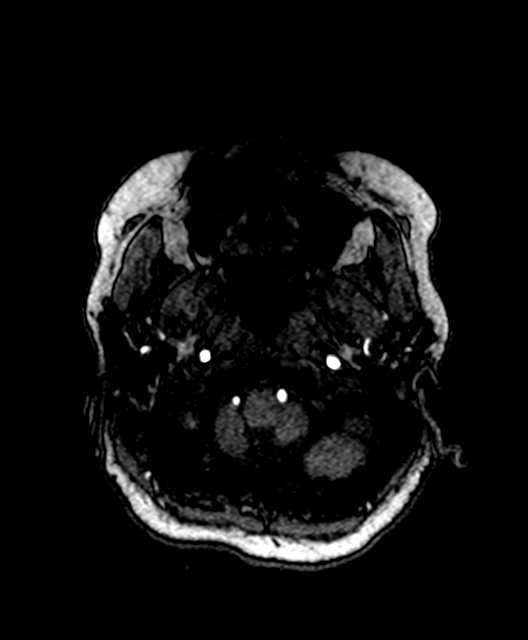
[im 23/131]
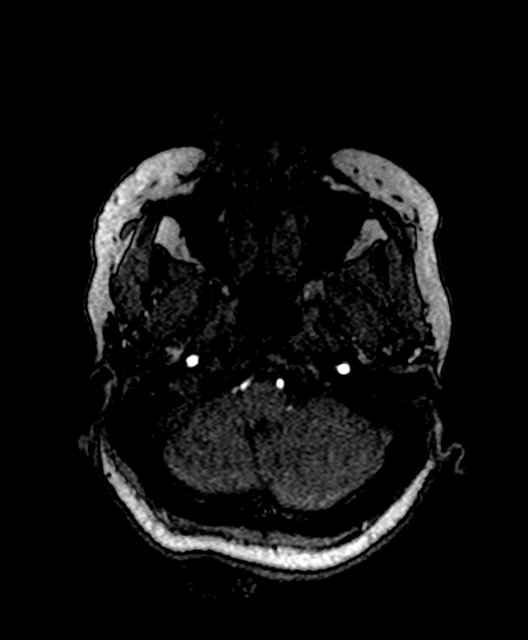
[im 25/131]
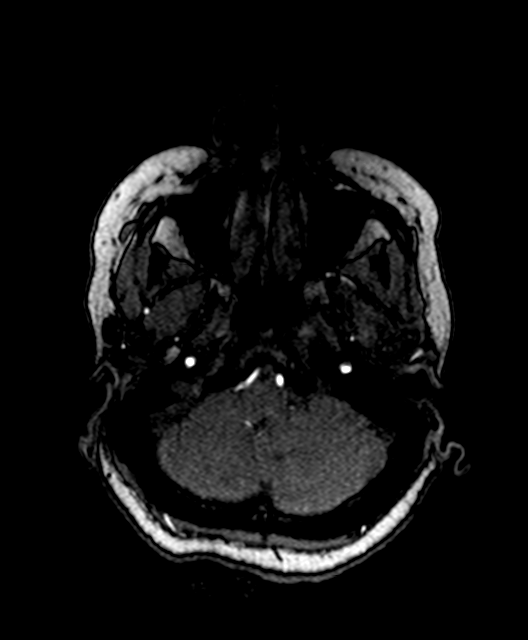
[im 42/131]
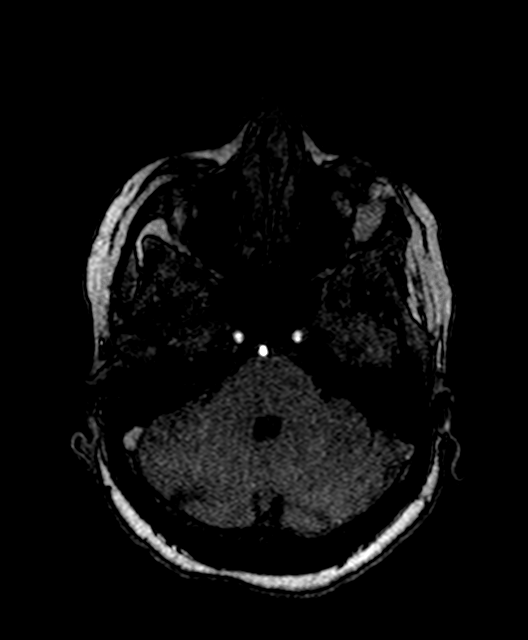
[im 59/131]
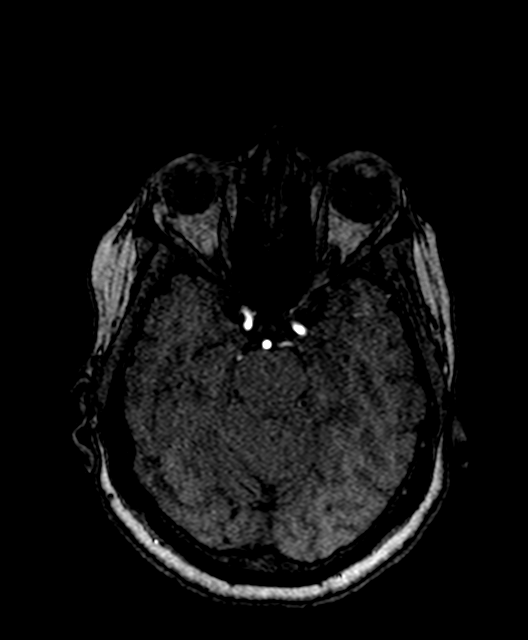
[im 67/131]
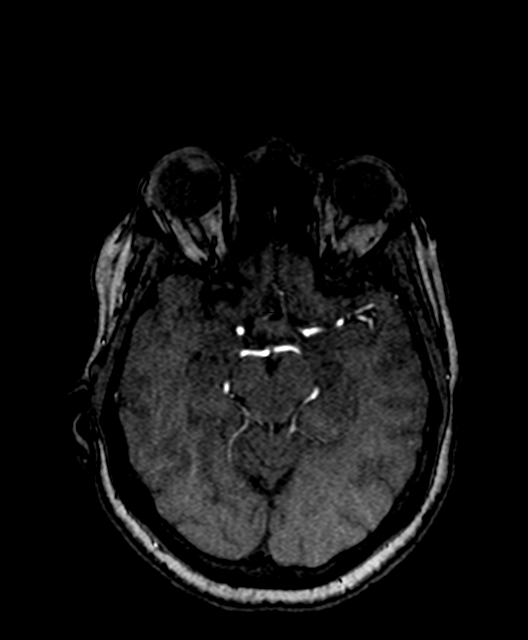
[im 75/131]
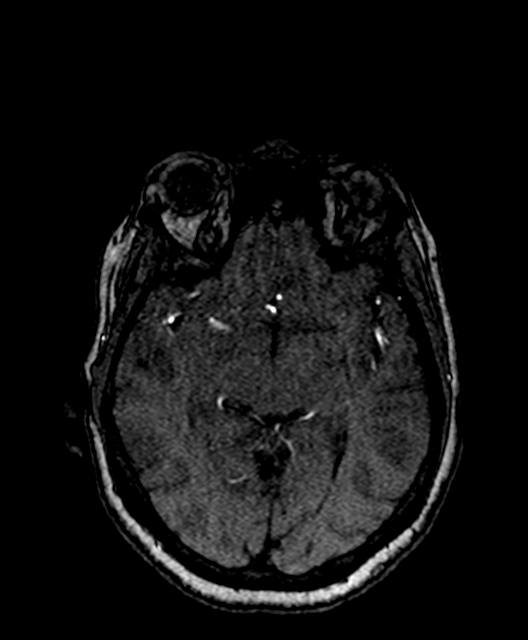
[im 92/131]
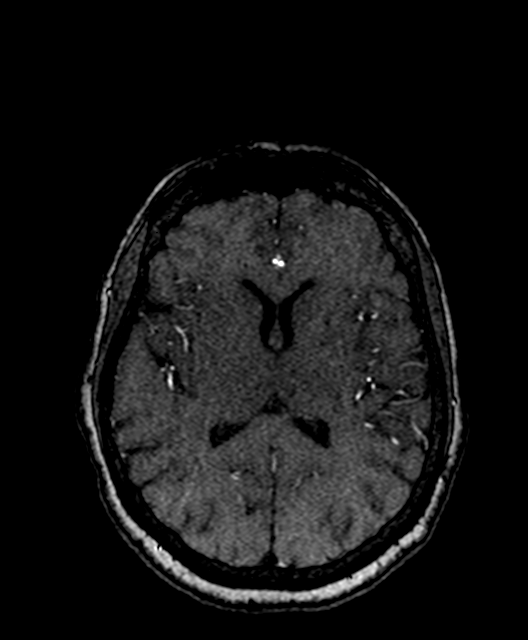
[im 108/131]
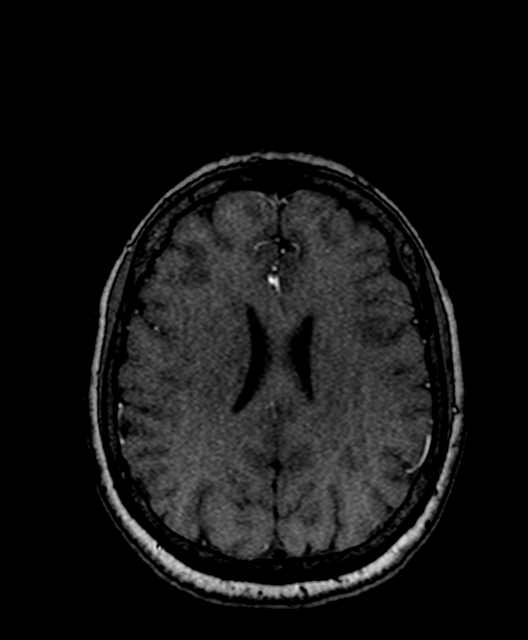
[im 111/131]
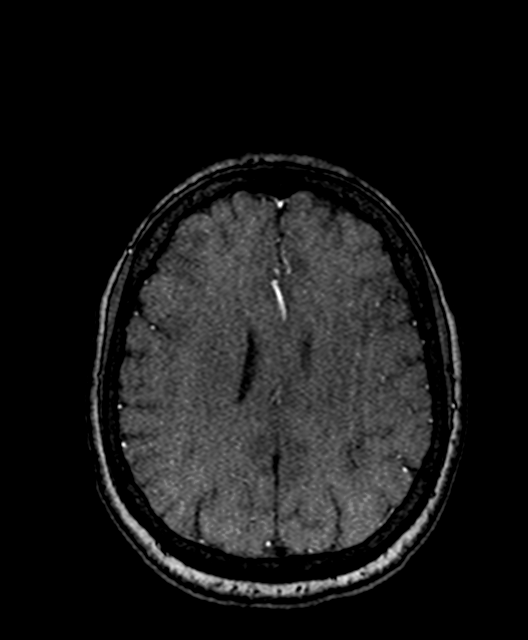
[im 125/131]
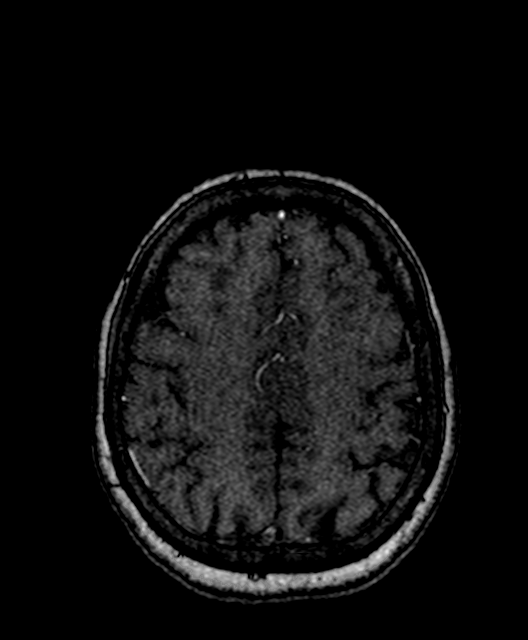

[16 of 48 positions shown; findings below may reference images not displayed]

FINDINGS: Left dominant vertebral artery. No vertebrobasilar stenosis. No
carotid stenosis is seen either-irregularity at the bilateral
petrous segment is typical of artifact from the skull base. There
could be medium vessel atheromatous changes, but this may instead be
artifact. An apparent left M1 origin stenosis on reformats is not
seen on source images. No evidence of aneurysm or vascular
malformation. Hypoplastic right A1 segment.
IMPRESSION: No emergent finding. No vertebrobasilar stenosis to explain
symptoms.

## 2019-12-03 IMAGING — CT CT HEAD W/O CM
3 series · 15 of 45 positions shown, 18 images · non-contrast
Comparison: None.

CLINICAL DATA: Generalized weakness

EXAM:
CT HEAD WITHOUT CONTRAST
TECHNIQUE: Contiguous axial images were obtained from the base of the skull
through the vertex without intravenous contrast.

[Series 2: head w o · axial · 0.42mm/px · z∈[+98,+213]mm · 9 of 28 slices shown, 12 images]
[im 3/28  brain]
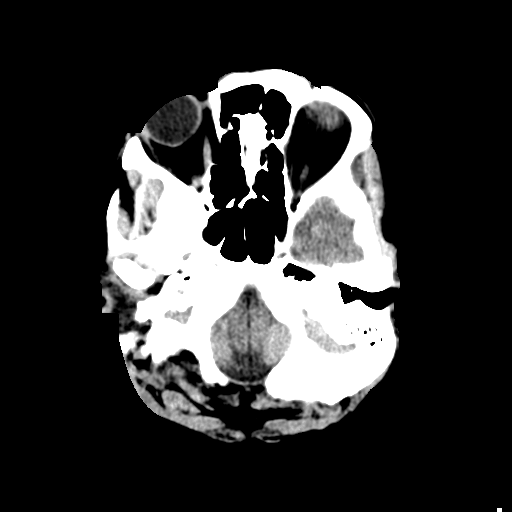
[im 3/28  bone]
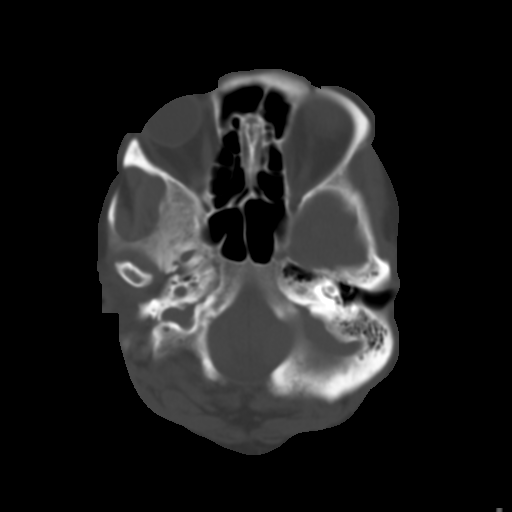
[im 6/28  brain]
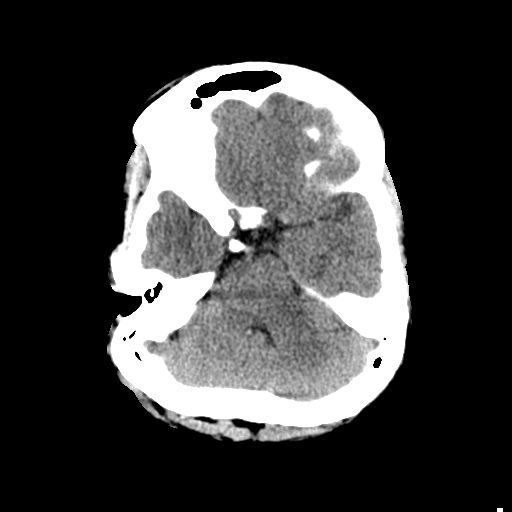
[im 9/28  brain]
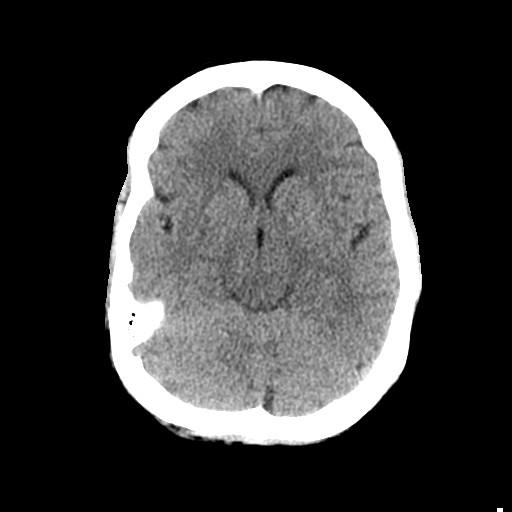
[im 12/28  brain]
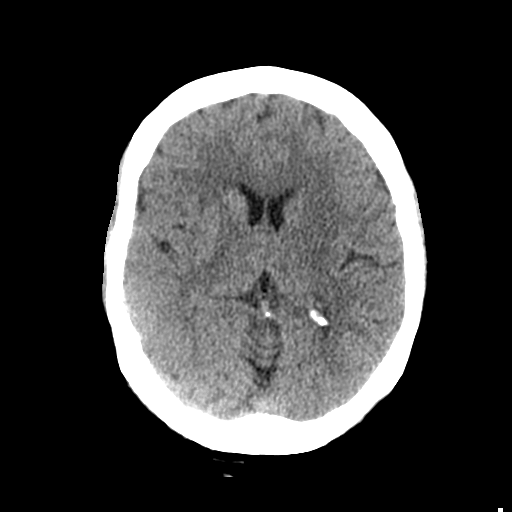
[im 15/28  brain]
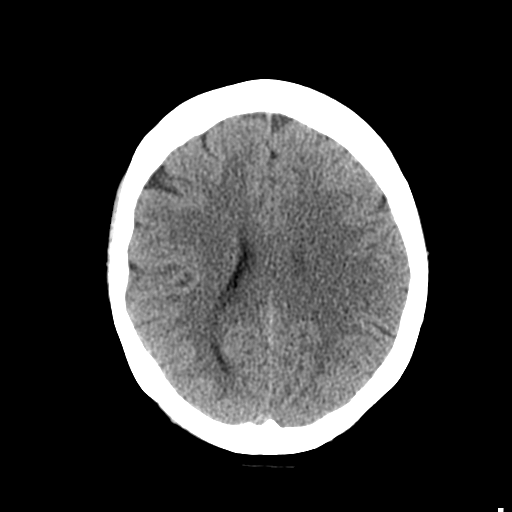
[im 15/28  bone]
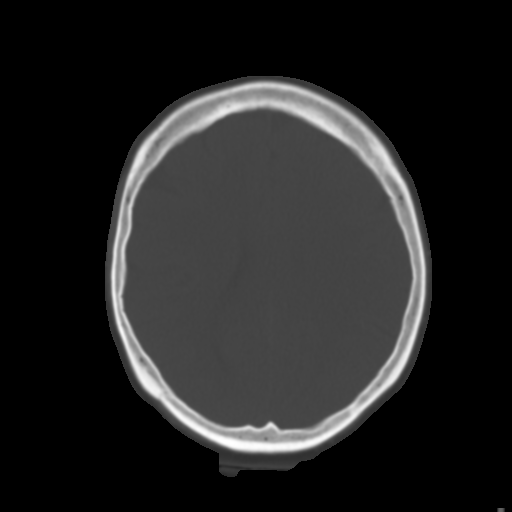
[im 17/28  brain]
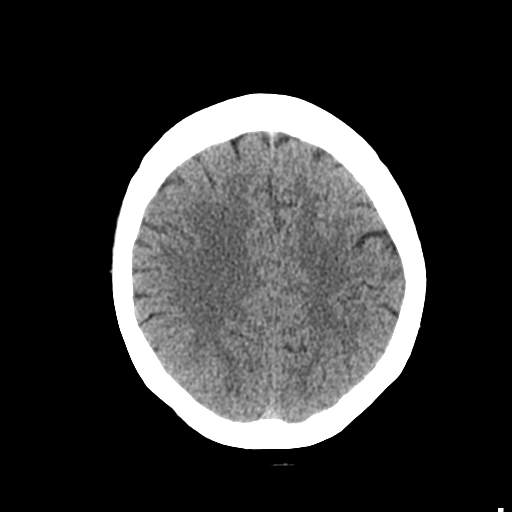
[im 20/28  brain]
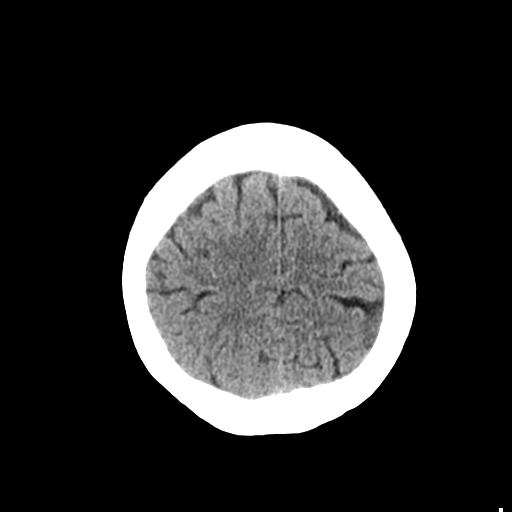
[im 23/28  brain]
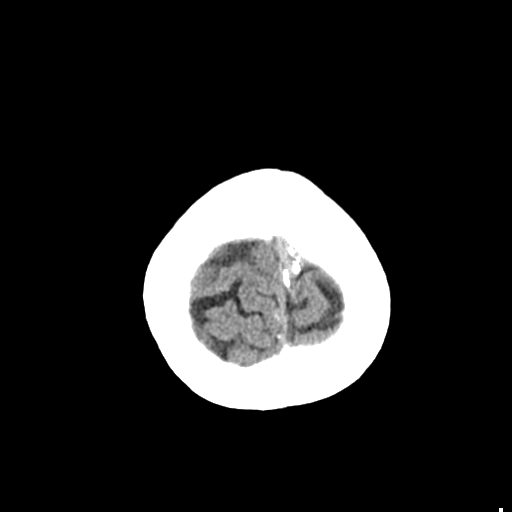
[im 26/28  brain]
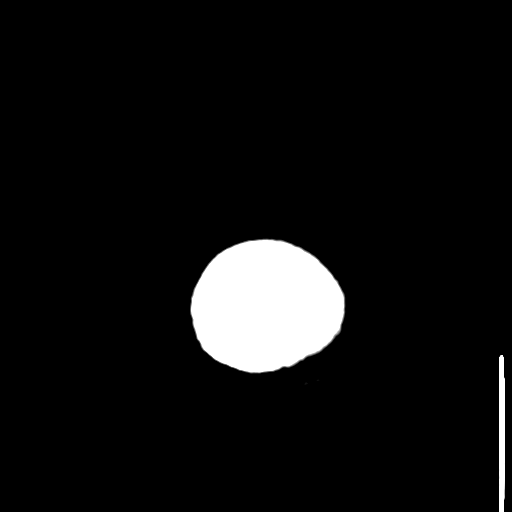
[im 26/28  bone]
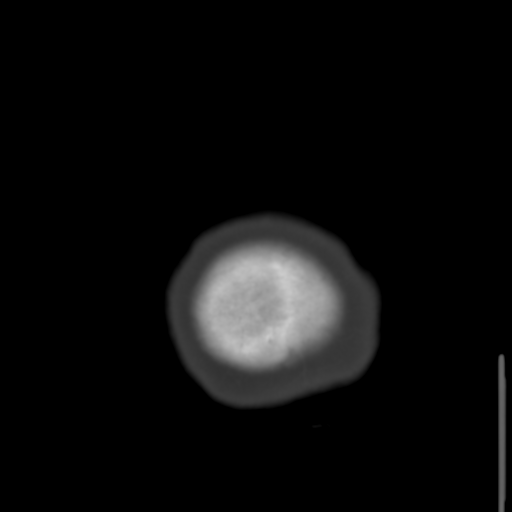

[Series 4: coronal soft · coronal · 0.29mm/px · 3 of 59 slices shown]
[im 20/59  brain]
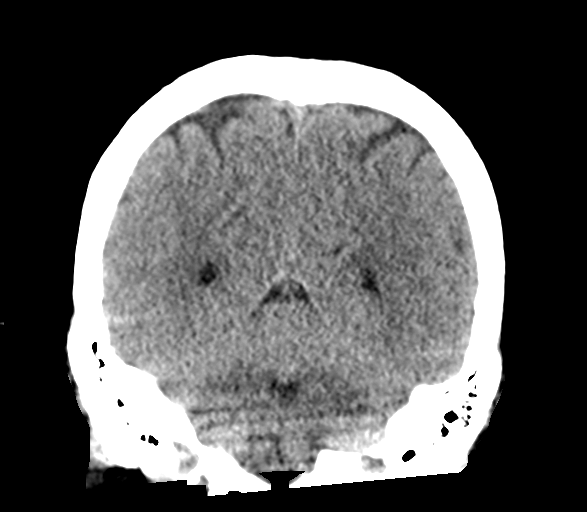
[im 26/59  brain]
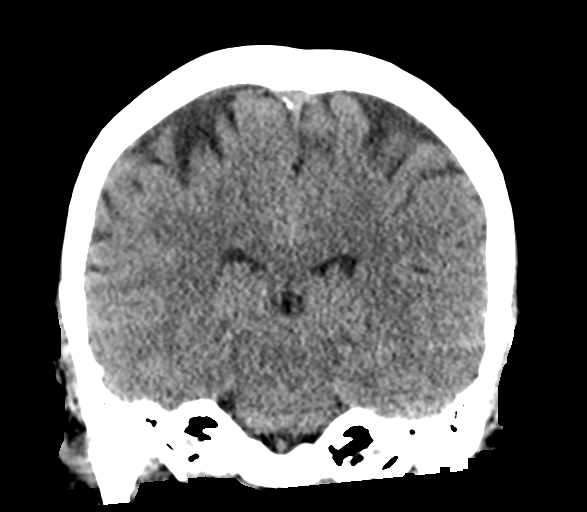
[im 33/59  brain]
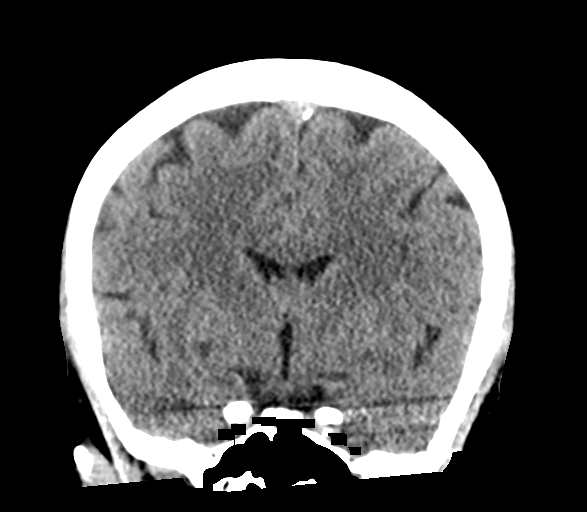

[Series 5: sagittal soft · sagittal · 0.28mm/px · 3 of 52 slices shown]
[im 18/52  brain]
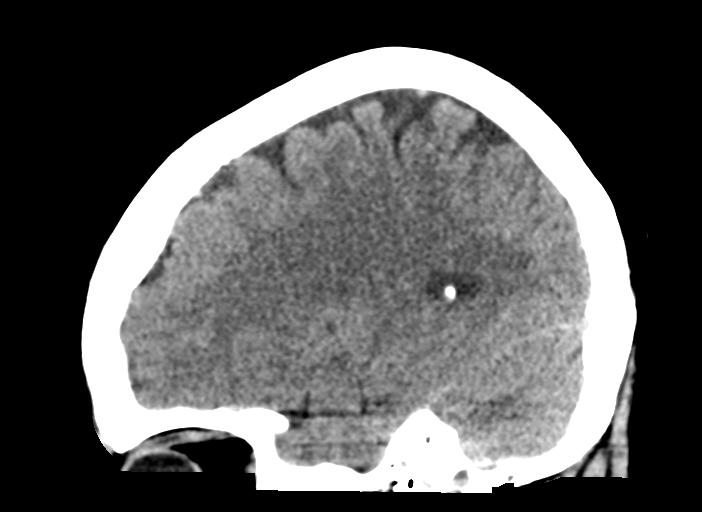
[im 26/52  brain]
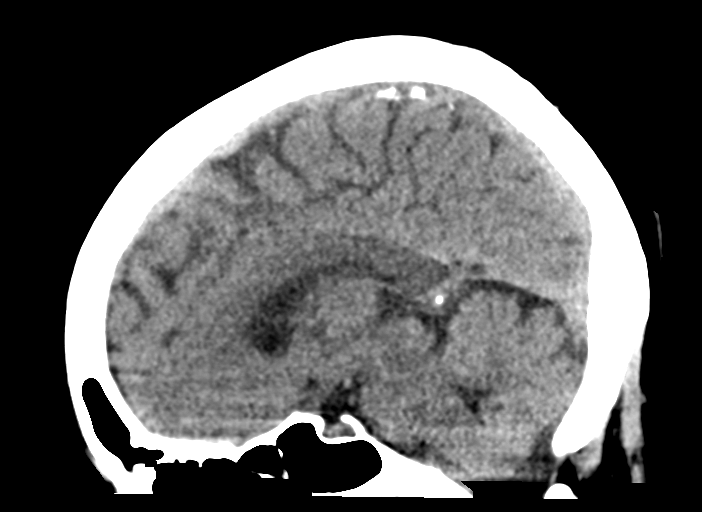
[im 35/52  brain]
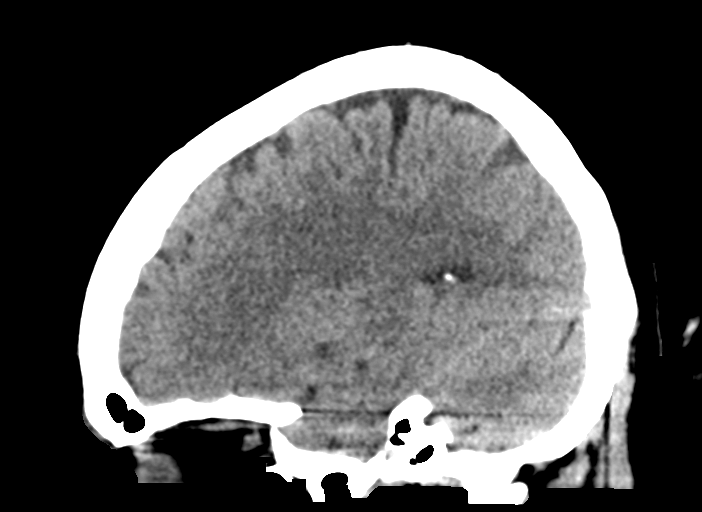

[15 of 45 positions shown; findings below may reference images not displayed]

FINDINGS: Brain: There is no mass, hemorrhage or extra-axial collection. The
size and configuration of the ventricles and extra-axial CSF spaces
are normal. The brain parenchyma is normal, without acute or chronic
infarction.

Vascular: No abnormal hyperdensity of the major intracranial
arteries or dural venous sinuses. No intracranial atherosclerosis.

Skull: The visualized skull base, calvarium and extracranial soft
tissues are normal.

Sinuses/Orbits: No fluid levels or advanced mucosal thickening of
the visualized paranasal sinuses. No mastoid or middle ear effusion.
The orbits are normal.
IMPRESSION: Normal head CT.

## 2019-12-03 IMAGING — MR MR MRA NECK WO/W CM
3 of 4 series · 21 of 48 positions shown · IV contrast (gadavist)
Comparison: None.

CLINICAL DATA: Generalized weakness with nausea and vomiting.

EXAM:
MRA NECK WITHOUT AND WITH CONTRAST
TECHNIQUE: Multiplanar and multiecho pulse sequences of the neck were obtained
without and with intravenous contrast. Angiographic images of the
neck were obtained using MRA technique without and with intravenous
contrast.
CONTRAST:  7mL GADAVIST GADOBUTROL 1 MMOL/ML IV SOLN

[Series 4: tof_3d_multi-slab · axial · 1.0mm · 0.52mm/px · z∈[-265,-175]mm · 11 of 104 slices shown]
[im 7/104]
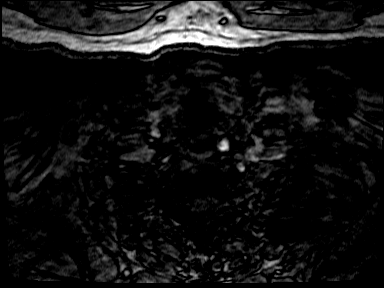
[im 13/104]
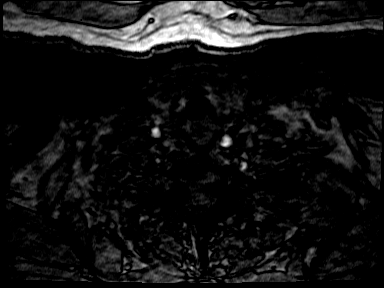
[im 20/104]
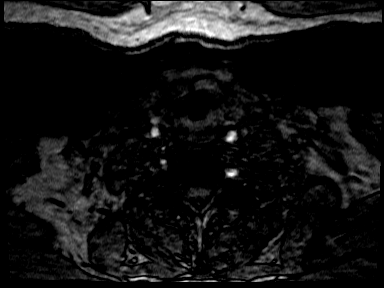
[im 33/104]
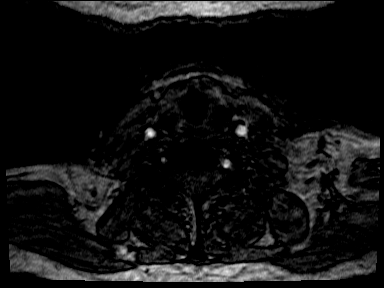
[im 46/104]
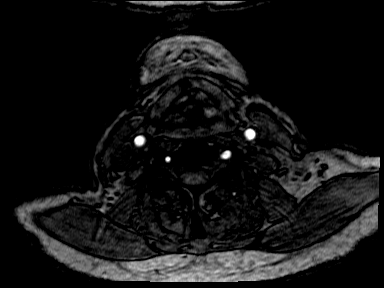
[im 52/104]
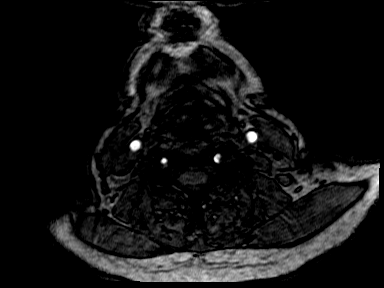
[im 58/104]
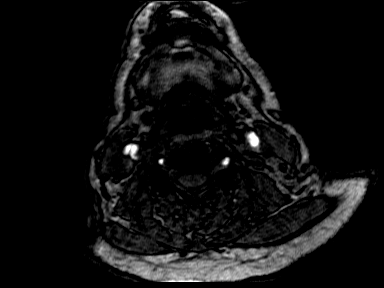
[im 71/104]
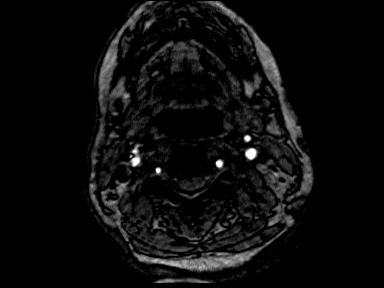
[im 84/104]
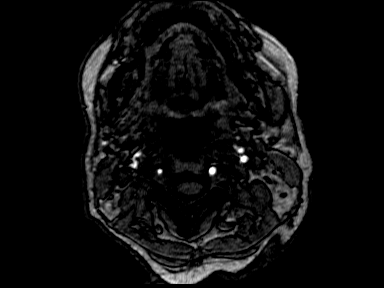
[im 91/104]
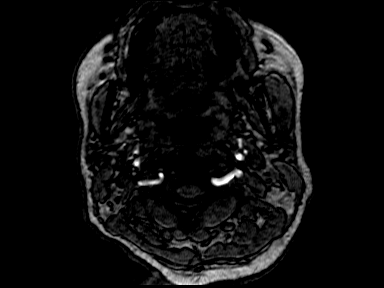
[im 97/104]
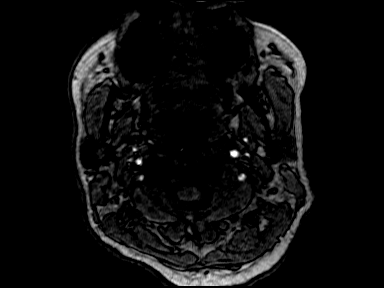

[Series 7: (id)_cor_pre · coronal · 1.2mm · 0.41mm/px · 7 of 80 slices shown]
[im 1/80]
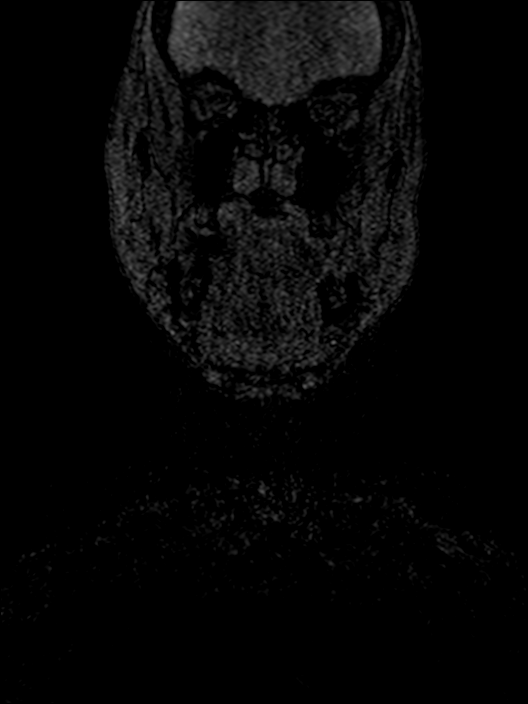
[im 13/80]
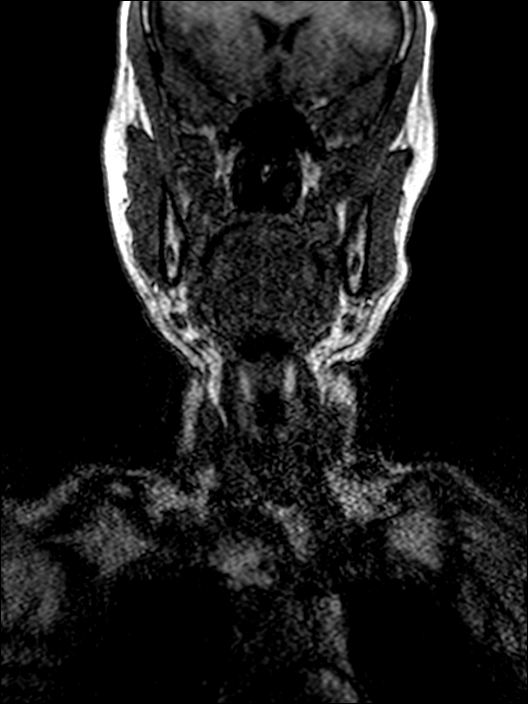
[im 25/80]
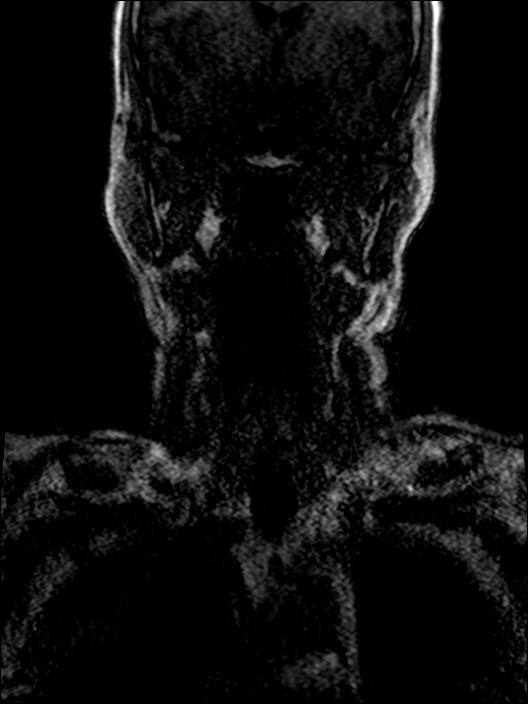
[im 37/80]
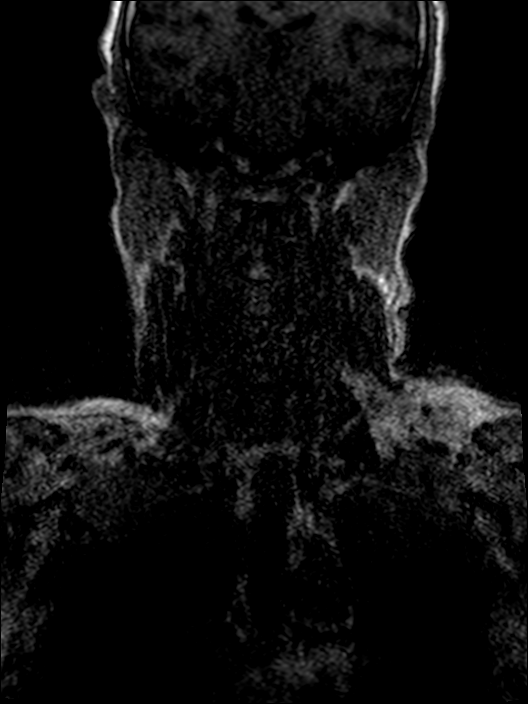
[im 43/80]
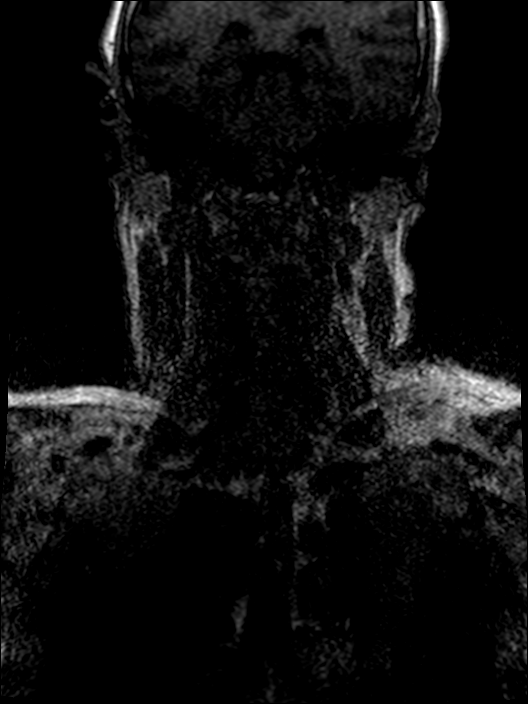
[im 55/80]
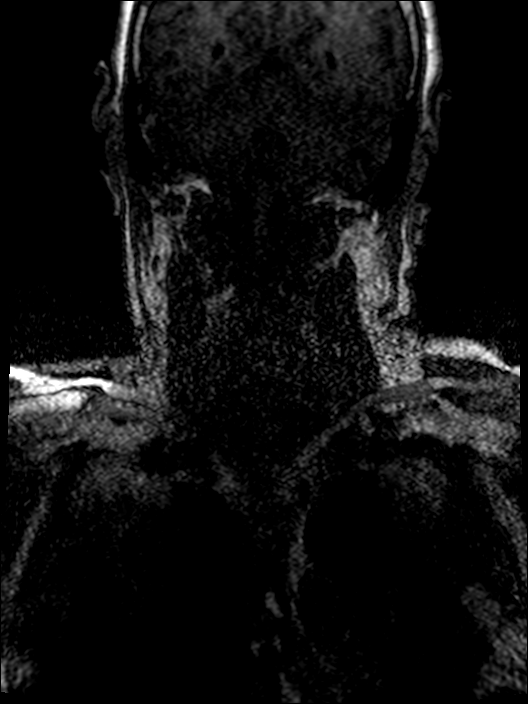
[im 67/80]
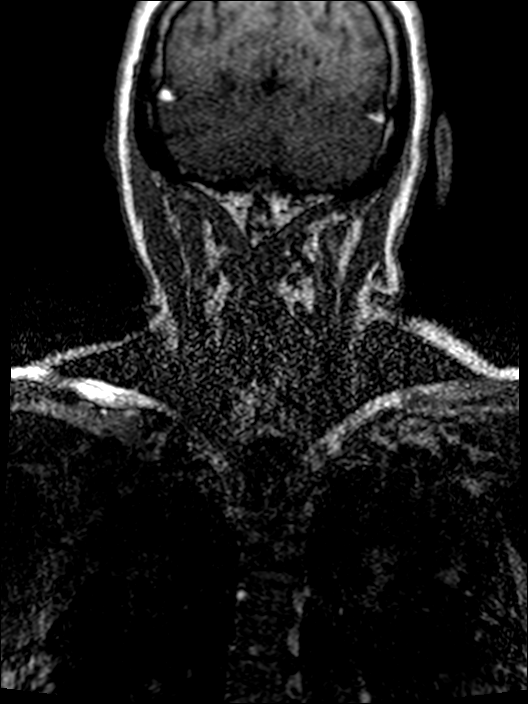

[Series 14: (id)_cor_post · coronal · 1.2mm · 0.41mm/px · 3 of 80 slices shown]
[im 13/80]
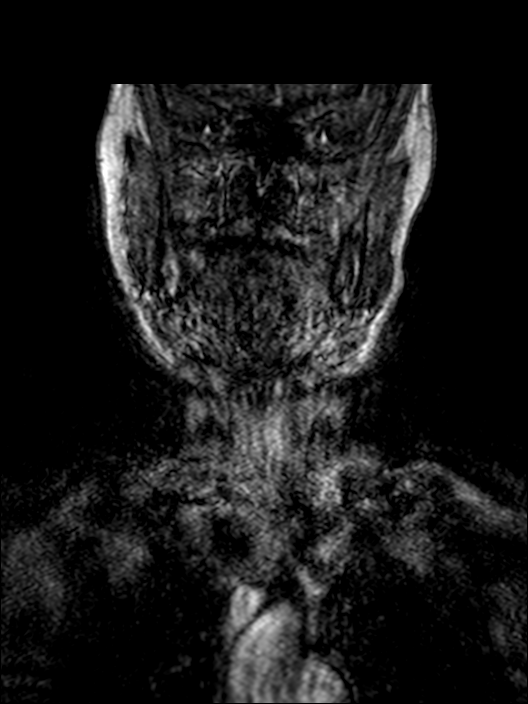
[im 43/80]
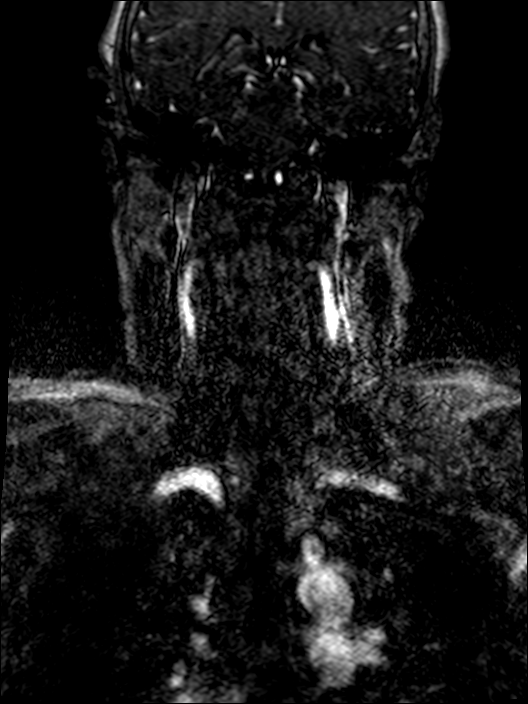
[im 67/80]
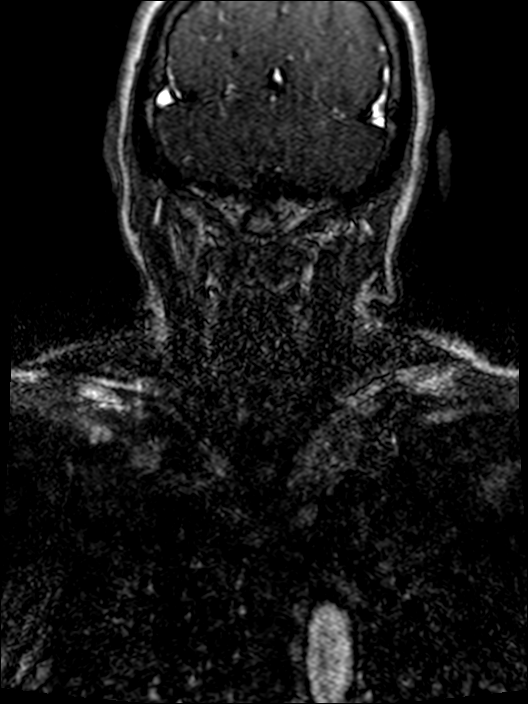

[21 of 48 positions shown; findings below may reference images not displayed]

FINDINGS: Antegrade flow in both carotid and vertebral arteries by
time-of-flight imaging.

Normal appearance of the arch with 3 vessel branching. Motion
artifact affects visualization of the vertebral origins, but no
downstream evidence of significant stenosis. Left vertebral artery
is dominant. No proximal subclavian stenosis. No noted flow limiting
stenosis or ulceration in the carotids.
IMPRESSION: 1. No emergent finding. No visible flow limiting stenosis or
ulceration.
2. Poor visualization of the non dominant right vertebral origin is
likely from artifact. Otherwise robust flow is seen in the posterior
circulation-no evidence of vertebrobasilar insufficiency.

## 2019-12-03 MED ORDER — ONDANSETRON 4 MG PO TBDP
ORAL_TABLET | ORAL | 0 refills | Status: AC
Start: 2019-12-03 — End: ?

## 2019-12-03 MED ORDER — LORAZEPAM 2 MG/ML IJ SOLN
0.5000 mg | Freq: Once | INTRAMUSCULAR | Status: AC
  Administered 2019-12-03: 0.5 mg via INTRAVENOUS
  Filled 2019-12-03: qty 1

## 2019-12-03 MED ORDER — SODIUM CHLORIDE 0.9 % IV BOLUS
500.0000 mL | Freq: Once | INTRAVENOUS | Status: AC
  Administered 2019-12-03: 500 mL via INTRAVENOUS

## 2019-12-03 MED ORDER — MECLIZINE HCL 25 MG PO TABS: 25.0000 mg | ORAL_TABLET | Freq: Two times a day (BID) | ORAL | 0 refills | Status: DC | PRN

## 2019-12-03 MED ORDER — POTASSIUM CHLORIDE 10 MEQ/100ML IV SOLN
10.0000 meq | Freq: Once | INTRAVENOUS | Status: AC
  Administered 2019-12-03: 10 meq via INTRAVENOUS
  Filled 2019-12-03: qty 100

## 2019-12-03 MED ORDER — POTASSIUM CHLORIDE CRYS ER 20 MEQ PO TBCR
40.0000 meq | EXTENDED_RELEASE_TABLET | Freq: Once | ORAL | Status: AC
  Administered 2019-12-03: 40 meq via ORAL
  Filled 2019-12-03: qty 2

## 2019-12-03 MED ORDER — ONDANSETRON HCL 4 MG/2ML IJ SOLN
4.0000 mg | Freq: Once | INTRAMUSCULAR | Status: AC
  Administered 2019-12-03: 4 mg via INTRAVENOUS
  Filled 2019-12-03: qty 2

## 2019-12-03 MED ORDER — GADOBUTROL 1 MMOL/ML IV SOLN
7.0000 mL | Freq: Once | INTRAVENOUS | Status: AC | PRN
  Administered 2019-12-03: 7 mL via INTRAVENOUS

## 2019-12-03 MED ORDER — MECLIZINE HCL 12.5 MG PO TABS
25.0000 mg | ORAL_TABLET | Freq: Once | ORAL | Status: AC
  Administered 2019-12-03: 25 mg via ORAL
  Filled 2019-12-03: qty 2

## 2019-12-03 NOTE — ED Triage Notes (Signed)
Pt reports generalized weakness, n/v, "can't hold my head up." pt states symptoms started tonight.

## 2019-12-03 NOTE — ED Notes (Signed)
Attempted to ambulate pt since she was "feeling much better." Pt states she was dizzy and "not right" sitting on side of bed. Pt began swaying back and forth after 2 steps and had to sit down. Dr. Blinda Leatherwood aware.

## 2019-12-03 NOTE — ED Provider Notes (Signed)
[facility] EMERGENCY DEPARTMENT Provider Note   CSN: [id number] Arrival date & time: 12/03/19  0244     History Chief Complaint  Patient presents with  . Weakness    Kristy Oneal is a 69 y.o. female.  Patient presents to the emergency department for evaluation of dizziness.  Patient reports that she rolled over in bed and immediately felt severe dizziness, nausea and vomiting.  She has had a history of vertigo but the symptoms are more severe.  She denies headache has not had any chest pain, heart palpitations, shortness of breath.  Denies abdominal pain.        Past Medical History:  Diagnosis Date  . Abnormal Pap smear   . History of abnormal cervical Pap smear 03/22/2014  . Hypertension   . Rectocele, female 03/22/2014  . Vaginal Pap smear, abnormal     Patient Active Problem List   Diagnosis Date Noted  . Encounter for screening colonoscopy   . Rectocele, female 03/22/2014  . History of abnormal cervical Pap smear 03/22/2014  . Mild dysplasia of cervix 04/06/2013  . PUD 10/29/2008  . CARPAL TUNNEL SYNDROME 06/05/2006  . HYPERTENSION 06/05/2006  . ACNE NEC 06/05/2006    Past Surgical History:  Procedure Laterality Date  . BREAST SURGERY     fibroid removed  . COLONOSCOPY N/A 02/06/2017   Procedure: COLONOSCOPY;  Surgeon: Danie Binder, MD;  Location: [facility];  Service: Endoscopy;  Laterality: N/A;  2:00pm  . ENDOMETRIAL ABLATION    . tubal liagtion    . TUBAL LIGATION       OB History    Gravida  2   Para  2   Term      Preterm      AB      Living  2     SAB      TAB      Ectopic      Multiple      Live Births  2           Family History  Problem Relation Age of Onset  . Cancer Mother        liver    Social History   Tobacco Use  . Smoking status: Never Smoker  . Smokeless tobacco: Never Used  Substance Use Topics  . Alcohol use: No  . Drug use: No    Home Medications Prior to Admission medications     Medication Sig Start Date End Date Taking? Authorizing Provider  amLODipine (NORVASC) 10 MG tablet Take 10 mg by mouth daily.    [provider]  aspirin 81 MG tablet Take 81 mg by mouth daily.    [provider]  Calcium Carbonate-Vitamin D (CALCIUM 600+D PO) Take 1 tablet by mouth daily.    [provider]  diphenhydrAMINE (BENADRYL) 25 MG tablet Take 25 mg by mouth daily.    [provider]  Multiple Vitamins-Minerals (CENTRUM ULTRA WOMENS) TABS Take 1 tablet by mouth daily.    [provider]  vitamin B-12 (CYANOCOBALAMIN) 500 MCG tablet Take 500 mcg by mouth daily.    [provider]    Allergies    Patient has no known allergies.  Review of Systems   Review of Systems  Gastrointestinal: Positive for nausea and vomiting.  Neurological: Positive for dizziness.  All other systems reviewed and are negative.   Physical Exam Updated Vital Signs BP 124/76   Pulse 83   Resp 12  Ht 5' (1.524 m)   Wt 54.4 kg   SpO2 98%   BMI 23.44 kg/m   Physical Exam Vitals and nursing note reviewed.  Constitutional:      General: She is not in acute distress.    Appearance: Normal appearance. She is well-developed.  HENT:     Head: Normocephalic and atraumatic.     Right Ear: Hearing normal.     Left Ear: Hearing normal.     Nose: Nose normal.  Eyes:     Conjunctiva/sclera: Conjunctivae normal.     Pupils: Pupils are equal, round, and reactive to light.  Cardiovascular:     Rate and Rhythm: Regular rhythm.     Heart sounds: S1 normal and S2 normal. No murmur. No friction rub. No gallop.   Pulmonary:     Effort: Pulmonary effort is normal. No respiratory distress.     Breath sounds: Normal breath sounds.  Chest:     Chest wall: No tenderness.  Abdominal:     General: Bowel sounds are normal.     Palpations: Abdomen is soft.     Tenderness: There is no abdominal tenderness. There is no guarding or rebound. Negative signs  include Murphy's sign and McBurney's sign.     Hernia: No hernia is present.  Musculoskeletal:        General: Normal range of motion.     Cervical back: Normal range of motion and neck supple.  Skin:    General: Skin is warm and dry.     Findings: No rash.  Neurological:     Mental Status: She is alert and oriented to person, place, and time.     GCS: GCS eye subscore is 4. GCS verbal subscore is 5. GCS motor subscore is 6.     Cranial Nerves: No cranial nerve deficit.     Sensory: No sensory deficit.     Coordination: Coordination normal.  Psychiatric:        Speech: Speech normal.        Behavior: Behavior normal.        Thought Content: Thought content normal.     ED Results / Procedures / Treatments   Labs (all labs ordered are listed, but only abnormal results are displayed) Labs Reviewed  CBC WITH DIFFERENTIAL/PLATELET - Abnormal; Notable for the following components:      Result Value   WBC 11.0 (*)    Neutro Abs 8.5 (*)    All other components within normal limits  COMPREHENSIVE METABOLIC PANEL - Abnormal; Notable for the following components:   Potassium 3.0 (*)    Glucose, Bld 192 (*)    All other components within normal limits  LIPASE, BLOOD - Abnormal; Notable for the following components:   Lipase 154 (*)    All other components within normal limits  URINALYSIS, ROUTINE W REFLEX MICROSCOPIC - Abnormal; Notable for the following components:   Color, Urine STRAW (*)    Specific Gravity, Urine 1.003 (*)    pH 9.0 (*)    Glucose, UA >=500 (*)    Protein, ur 30 (*)    All other components within normal limits    EKG EKG Interpretation  Date/Time:  Thursday Dec 03 2019 03:25:51 EDT Ventricular Rate:  71 PR Interval:    QRS Duration: 95 QT Interval:  429 QTC Calculation: 467 R Axis:   34 Text Interpretation: Sinus rhythm Right atrial enlargement Abnormal R-wave progression, early transition Confirmed by Gilda Crease [phone]) on 12/03/2019  3:30:04 AM   Radiology CT HEAD WO CONTRAST  Result Date: 12/03/2019 CLINICAL DATA:  Generalized weakness EXAM: CT HEAD WITHOUT CONTRAST TECHNIQUE: Contiguous axial images were obtained from the base of the skull through the vertex without intravenous contrast. COMPARISON:  None. FINDINGS: Brain: There is no mass, hemorrhage or extra-axial collection. The size and configuration of the ventricles and extra-axial CSF spaces are normal. The brain parenchyma is normal, without acute or chronic infarction. Vascular: No abnormal hyperdensity of the major intracranial arteries or dural venous sinuses. No intracranial atherosclerosis. Skull: The visualized skull base, calvarium and extracranial soft tissues are normal. Sinuses/Orbits: No fluid levels or advanced mucosal thickening of the visualized paranasal sinuses. No mastoid or middle ear effusion. The orbits are normal. IMPRESSION: Normal head CT. Electronically Signed   By: Deatra Robinson M.D.   On: 12/03/2019 03:54    Procedures Procedures (including critical care time)  Medications Ordered in ED Medications  sodium chloride 0.9 % bolus 500 mL (500 mLs Intravenous New Bag/Given 12/03/19 0317)  ondansetron (ZOFRAN) injection 4 mg (4 mg Intravenous Given 12/03/19 0318)  LORazepam (ATIVAN) injection 0.5 mg (0.5 mg Intravenous Given 12/03/19 0317)  meclizine (ANTIVERT) tablet 25 mg (25 mg Oral Given 12/03/19 0319)  LORazepam (ATIVAN) injection 0.5 mg (0.5 mg Intravenous Given 12/03/19 0534)  sodium chloride 0.9 % bolus 500 mL (500 mLs Intravenous New Bag/Given 12/03/19 0534)    ED Course  I have reviewed the triage vital signs and the nursing notes.  Pertinent labs & imaging results that were available during my care of the patient were reviewed by me and considered in my medical decision making (see chart for details).    MDM Rules/Calculators/A&P                      Patient presents to the emergency department for evaluation of acute onset of  dizziness.  Patient reports that she rolled over in bed and became dizzy.  Dizziness is severe causing her to have inability to get up and move around.  She has associated nausea and vomiting.  No focal findings on exam.  Patient administered IV fluids, Ativan, meclizine.  She reports some improvement but still having difficulty with ambulation because of dizziness when she stands up.  Will obtain MRI.  Signed out to oncoming ER physician to follow-up.  Lab work otherwise unremarkable.  She did have a mildly elevated lipase of unclear etiology.  She has no abdominal pain and there is no tenderness.  Doubt intra-abdominal cause of her vomiting, does not require abdominal imaging at this time.  Final Clinical Impression(s) / ED Diagnoses Final diagnoses:  Vertigo    Rx / DC Orders ED Discharge Orders    None       Tynasia Mccaul, Canary Brim, MD 12/03/19 [phone]

## 2019-12-03 NOTE — ED Provider Notes (Signed)
Patient CARE signed out to follow-up MRI result looking for occult stroke.  Patient presented with dizziness nausea and vomiting.  MRI results reviewed no acute abnormality.  Plan for close outpatient follow-up with meclizine as needed and Zofran for nausea.  Kenton Kingfisher, MD 12/03/19 1043

## 2019-12-03 NOTE — Discharge Instructions (Signed)
Your lipase (pancreas enzyme) was elevated. Have it rechecked by your primary doctor in a few weeks and return for abdominal pain or persistent vomiting. Your potassium was low, have that rechecked the same time you have blood work done by your primary doctor.

## 2019-12-04 DIAGNOSIS — H8110 Benign paroxysmal vertigo, unspecified ear: Secondary | ICD-10-CM | POA: Diagnosis not present

## 2019-12-04 DIAGNOSIS — Z681 Body mass index (BMI) 19 or less, adult: Secondary | ICD-10-CM | POA: Diagnosis not present

## 2020-01-27 DIAGNOSIS — R748 Abnormal levels of other serum enzymes: Secondary | ICD-10-CM | POA: Diagnosis not present

## 2020-04-04 DIAGNOSIS — Z111 Encounter for screening for respiratory tuberculosis: Secondary | ICD-10-CM | POA: Diagnosis not present

## 2020-04-07 ENCOUNTER — Encounter: Payer: Self-pay | Admitting: Internal Medicine

## 2020-04-11 DIAGNOSIS — Z23 Encounter for immunization: Secondary | ICD-10-CM | POA: Diagnosis not present

## 2020-05-06 DIAGNOSIS — Z23 Encounter for immunization: Secondary | ICD-10-CM | POA: Diagnosis not present

## 2020-05-27 ENCOUNTER — Other Ambulatory Visit: Payer: Self-pay

## 2020-05-27 ENCOUNTER — Encounter: Payer: Self-pay | Admitting: Gastroenterology

## 2020-05-27 ENCOUNTER — Encounter: Payer: Self-pay | Admitting: *Deleted

## 2020-05-27 ENCOUNTER — Ambulatory Visit (INDEPENDENT_AMBULATORY_CARE_PROVIDER_SITE_OTHER): Payer: Medicare Other | Admitting: Gastroenterology

## 2020-05-27 DIAGNOSIS — R1906 Epigastric swelling, mass or lump: Secondary | ICD-10-CM

## 2020-05-27 DIAGNOSIS — R748 Abnormal levels of other serum enzymes: Secondary | ICD-10-CM | POA: Diagnosis not present

## 2020-05-27 DIAGNOSIS — R6881 Early satiety: Secondary | ICD-10-CM | POA: Insufficient documentation

## 2020-05-27 NOTE — Patient Instructions (Signed)
I have ordered a CT scan to further evaluate your pancreas.  Please complete blood work.  Further recommendations to follow!  It was a pleasure to see you today. I want to create trusting relationships with patients to provide genuine, compassionate, and quality care. I value your feedback. If you receive a survey regarding your visit,  I greatly appreciate you taking time to fill this out.   Gelene Mink, PhD, ANP-BC [facility]

## 2020-05-27 NOTE — Progress Notes (Signed)
Primary Care Physician:  Ladon Applebaum  Referring Physician: Terie Purser, PA-C Primary Gastroenterologist:  Dr. Marletta Lor  Chief Complaint  Patient presents with  . elevated lipase    no sx    HPI:   Kristy Oneal is a 69 y.o. female presenting today at the request of Terie Purser, PA-C< due to elevated lipase.    Lipase in May 2021 while in ED with vertigo was 154. Lipase in June 201. Outpatient lipase Sept 2021 was 495. LFTs remaining normal. No abdominal imaging. MR brain without contrast normal. MRA head and neck unrevealing. Performed in ED due to dizziness.   No abdominal pain, no overt GI bleeding. Denies any unexplained weight loss. States  years ago told she had ulcers.  No EGD. Over 10 years ago. Was treated with medications. Was taking NSAIDs at that time. Not taking currently.   Vague bloating currently and early satiety. No other symptoms.   Past Medical History:  Diagnosis Date  . Abnormal Pap smear   . History of abnormal cervical Pap smear 03/22/2014  . Hypertension   . Rectocele, female 03/22/2014  . Vaginal Pap smear, abnormal     Past Surgical History:  Procedure Laterality Date  . BREAST SURGERY     fibroid removed  . COLONOSCOPY N/A 02/06/2017   Recto-sigmoid colon and sigmoid moderately redundant. Internal hemorrhoids.   . ENDOMETRIAL ABLATION    . tubal liagtion    . TUBAL LIGATION      Current Outpatient Medications  Medication Sig Dispense Refill  . amLODipine (NORVASC) 10 MG tablet Take 10 mg by mouth daily.    Marland Kitchen aspirin 81 MG tablet Take 81 mg by mouth daily.    . Calcium Carbonate-Vitamin D (CALCIUM 600+D PO) Take 1 tablet by mouth daily.    . diphenhydrAMINE (BENADRYL) 25 MG tablet Take 25 mg by mouth daily.    . Multiple Vitamins-Minerals (CENTRUM ULTRA WOMENS) TABS Take 1 tablet by mouth daily.    . ondansetron (ZOFRAN ODT) 4 MG disintegrating tablet 4mg  ODT q4 hours prn nausea/vomit 4 tablet 0  . vitamin B-12  (CYANOCOBALAMIN) 500 MCG tablet Take 500 mcg by mouth daily.     No current facility-administered medications for this visit.    Allergies as of 05/27/2020  . (No Known Allergies)    Family History  Problem Relation Age of Onset  . Cancer Mother        liver  . Colon cancer Neg Hx   . Colon polyps Neg Hx   . Pancreatic cancer Neg Hx     Social History   Socioeconomic History  . Marital status: Married    Spouse name: Not on file  . Number of children: Not on file  . Years of education: Not on file  . Highest education level: Not on file  Occupational History  . Not on file  Tobacco Use  . Smoking status: Never Smoker  . Smokeless tobacco: Never Used  Substance and Sexual Activity  . Alcohol use: No  . Drug use: No  . Sexual activity: Yes    Birth control/protection: Surgical    Comment: tubal and ablation  Other Topics Concern  . Not on file  Social History Narrative  . Not on file   Social Determinants of Health   Financial Resource Strain:   . Difficulty of Paying Living Expenses: Not on file  Food Insecurity:   . Worried About 05/29/2020  in the Last Year: Not on file  . Ran Out of Food in the Last Year: Not on file  Transportation Needs:   . Lack of Transportation (Medical): Not on file  . Lack of Transportation (Non-Medical): Not on file  Physical Activity:   . Days of Exercise per Week: Not on file  . Minutes of Exercise per Session: Not on file  Stress:   . Feeling of Stress : Not on file  Social Connections:   . Frequency of Communication with Friends and Family: Not on file  . Frequency of Social Gatherings with Friends and Family: Not on file  . Attends Religious Services: Not on file  . Active Member of Clubs or Organizations: Not on file  . Attends Banker Meetings: Not on file  . Marital Status: Not on file  Intimate Partner Violence:   . Fear of Current or Ex-Partner: Not on file  . Emotionally Abused: Not on file    . Physically Abused: Not on file  . Sexually Abused: Not on file    Review of Systems: Gen: Denies any fever, chills, fatigue, weight loss, lack of appetite.  CV: Denies chest pain, heart palpitations, peripheral edema, syncope.  Resp: Denies shortness of breath at rest or with exertion. Denies wheezing or cough.  GI: see HPI GU : Denies urinary burning, urinary frequency, urinary hesitancy MS: Denies joint pain, muscle weakness, cramps, or limitation of movement.  Derm: Denies rash, itching, dry skin Psych: Denies depression, anxiety, memory loss, and confusion Heme: Denies bruising, bleeding, and enlarged lymph nodes.  Physical Exam: BP 134/77   Pulse 99   Temp (!) 96.8 F (36 C) (Temporal)   Ht 5\' 2"  (1.575 m)   Wt 124 lb (56.2 kg)   BMI 22.68 kg/m  General:   Alert and oriented. Pleasant and cooperative. Well-nourished and well-developed.  Head:  Normocephalic and atraumatic. Eyes:  Without icterus, sclera clear and conjunctiva pink.  Ears:  Normal auditory acuity. Mouth:  Mask in place Lungs:  Clear to auscultation bilaterally. No wheezes, rales, or rhonchi. No distress.  Heart:  S1, S2 present without murmurs appreciated.  Abdomen:  +BS, soft, non-tender and non-distended. No HSM noted. No guarding or rebound. No masses appreciated.  Rectal:  Deferred  Msk:  Symmetrical without gross deformities. Normal posture. Extremities:  Without edema. Neurologic:  Alert and  oriented x4;  grossly normal neurologically. Skin:  Intact without significant lesions or rashes. Psych:  Alert and cooperative. Normal mood and affect.  ASSESSMENT/PLAN: Kristy Oneal is a 69 y.o. female presenting today with elevated lipase but without abdominal pain, normal LFTs, and only symptoms of bloating and early satiety. Some question of ulcers in the past but no EGD to verify this.  Reviewed med list, and I do not see any typical meds that could cause this elevation. She does not have renal  disease, as elevated lipase can be seen in that setting. Query possibly PUD causing elevated lipase. Bloating and early satiety concerning symptoms.  Will recheck lipase, add amylase, and pursue CT abd/pelvis in near future. EGD if CT unrevealing.   78, PhD, ANP-BC [facility]

## 2020-05-31 ENCOUNTER — Other Ambulatory Visit (HOSPITAL_COMMUNITY)
Admission: RE | Admit: 2020-05-31 | Discharge: 2020-05-31 | Disposition: A | Discharge status: Home / Self Care | Payer: Medicare Other | Source: Ambulatory Visit | Attending: Gastroenterology | Admitting: Gastroenterology

## 2020-05-31 ENCOUNTER — Other Ambulatory Visit: Payer: Self-pay

## 2020-05-31 ENCOUNTER — Ambulatory Visit (HOSPITAL_COMMUNITY)
Admission: RE | Admit: 2020-05-31 | Discharge: 2020-05-31 | Disposition: A | Discharge status: Home / Self Care | Payer: Medicare Other | Source: Ambulatory Visit | Attending: Gastroenterology | Admitting: Gastroenterology

## 2020-05-31 DIAGNOSIS — R140 Abdominal distension (gaseous): Secondary | ICD-10-CM | POA: Diagnosis not present

## 2020-05-31 DIAGNOSIS — R1906 Epigastric swelling, mass or lump: Secondary | ICD-10-CM

## 2020-05-31 DIAGNOSIS — R748 Abnormal levels of other serum enzymes: Secondary | ICD-10-CM | POA: Insufficient documentation

## 2020-05-31 DIAGNOSIS — R6881 Early satiety: Secondary | ICD-10-CM | POA: Insufficient documentation

## 2020-05-31 LAB — AMYLASE: Amylase: 108 U/L — ABNORMAL HIGH (ref 28–100)

## 2020-05-31 LAB — POCT I-STAT CREATININE: Creatinine, Ser: 0.7 mg/dL (ref 0.44–1.00)

## 2020-05-31 LAB — LIPASE, BLOOD: Lipase: 46 U/L (ref 11–51)

## 2020-05-31 IMAGING — CT CT ABD-PELV W/ CM
2 of 5 series · 17 of 46 positions shown, 19 images · IV contrast (Omnipaque or Isovue)
Comparison: None.

CLINICAL DATA: Epigastric pain abdominal distension for
approximately 1 year. Anorexia. Elevated lipase.

EXAM:
CT ABDOMEN AND PELVIS WITH CONTRAST
TECHNIQUE: Multidetector CT imaging of the abdomen and pelvis was performed
using the standard protocol following bolus administration of
intravenous contrast.
CONTRAST:  100mL OMNIPAQUE IOHEXOL 300 MG/ML  SOLN

[Series 2: axial st · axial · 0.66mm/px · z∈[+1123,+1493]mm · 14 of 84 slices shown, 16 images]
[im 5/84  soft-tissue]
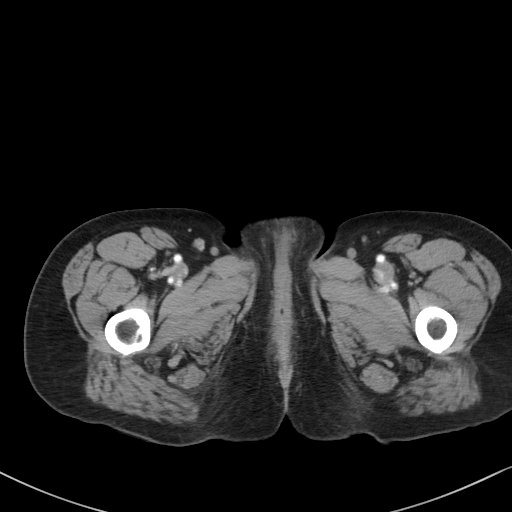
[im 5/84  bone]
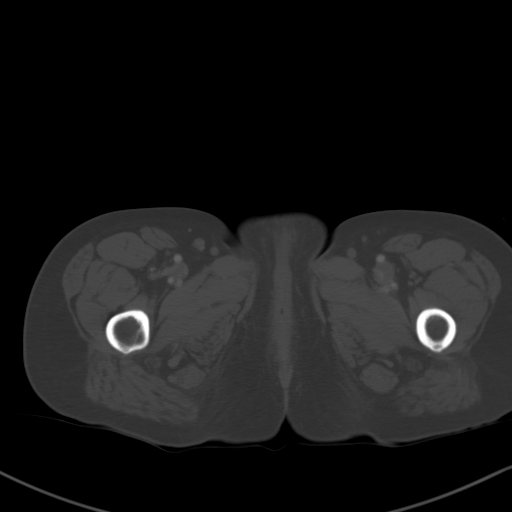
[im 9/84  soft-tissue]
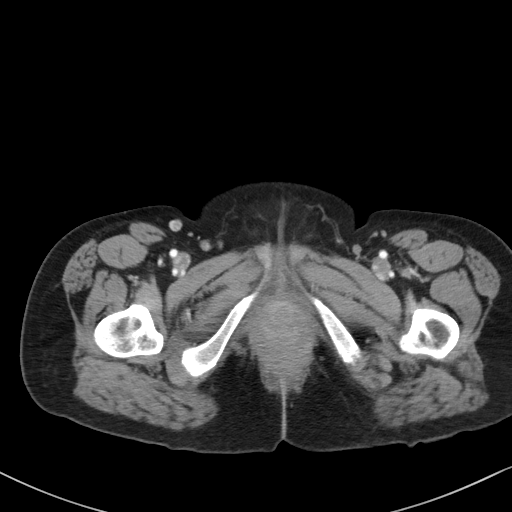
[im 18/84  soft-tissue]
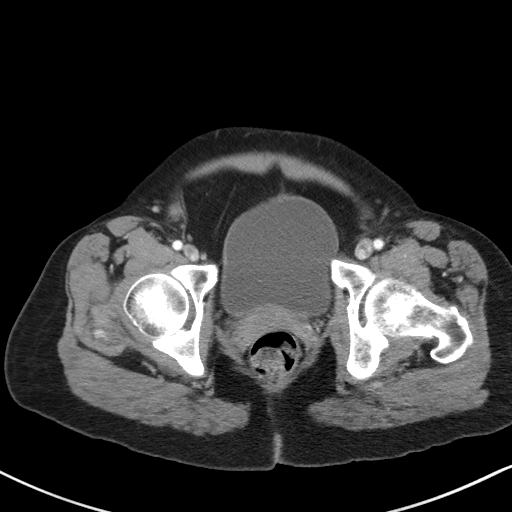
[im 22/84  soft-tissue]
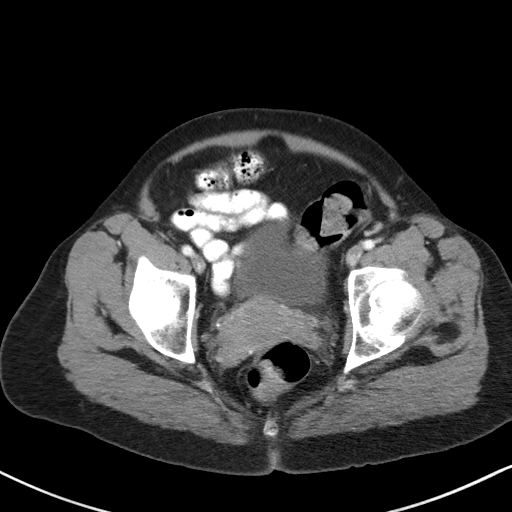
[im 27/84  soft-tissue]
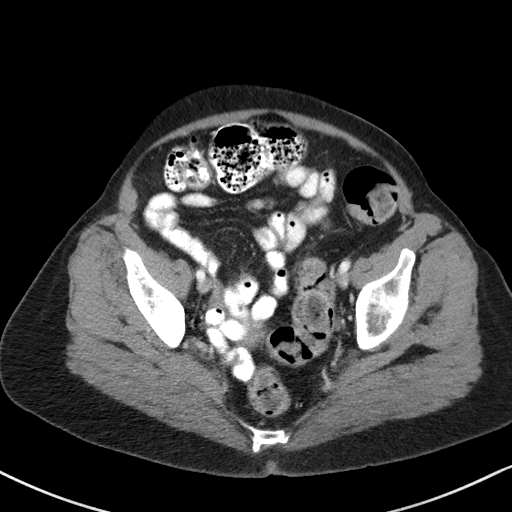
[im 35/84  soft-tissue]
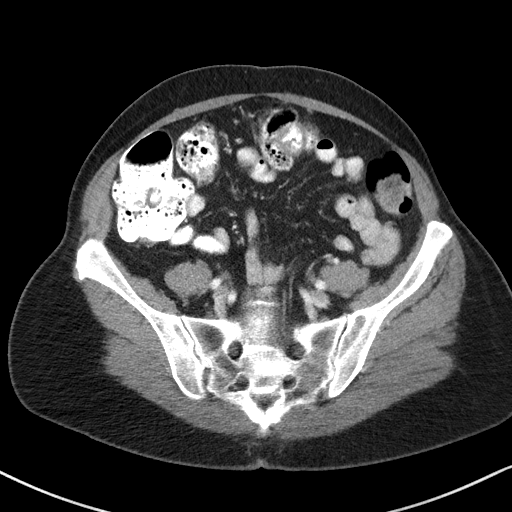
[im 40/84  soft-tissue]
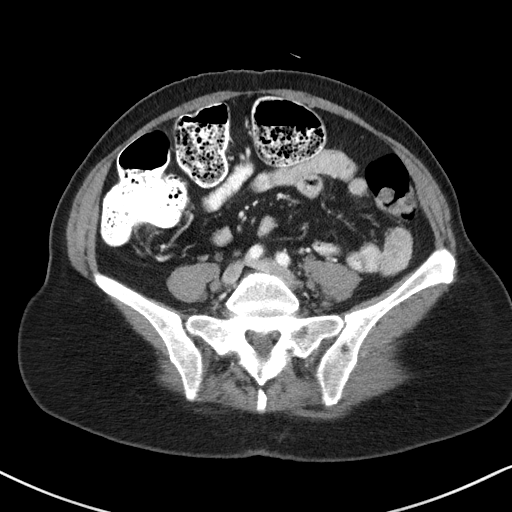
[im 44/84  soft-tissue]
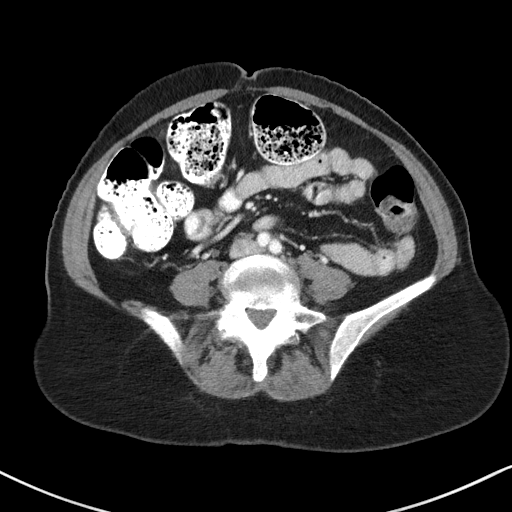
[im 49/84  soft-tissue]
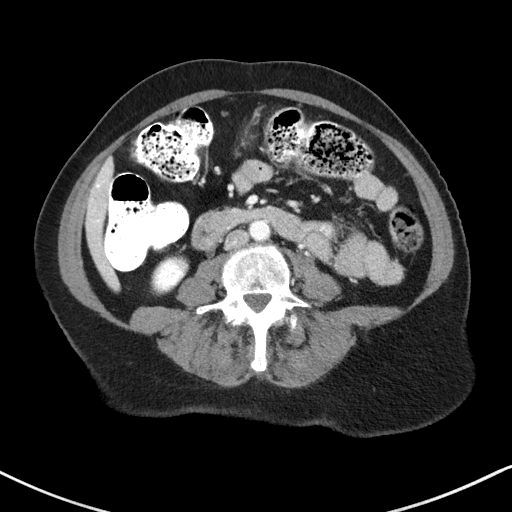
[im 49/84  bone]
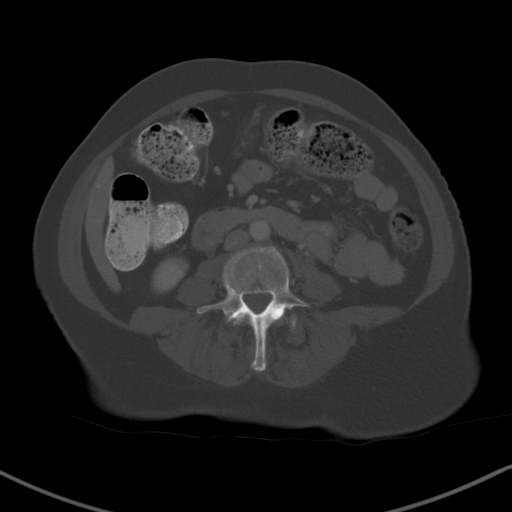
[im 57/84  soft-tissue]
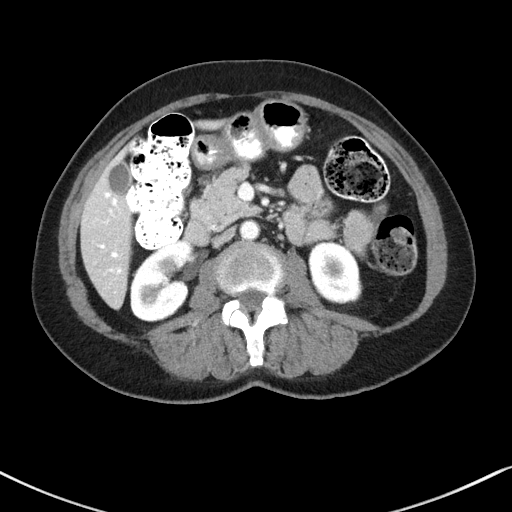
[im 62/84  soft-tissue]
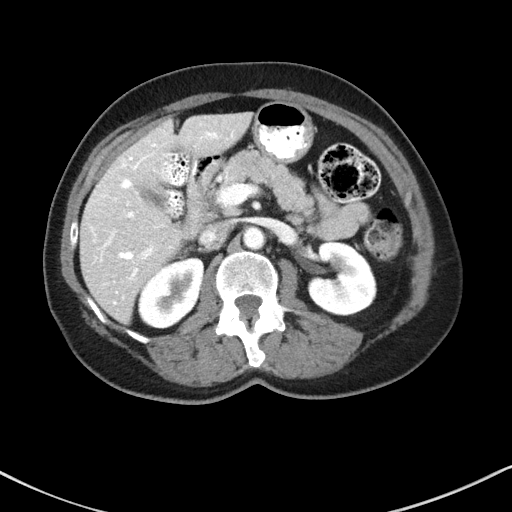
[im 66/84  soft-tissue]
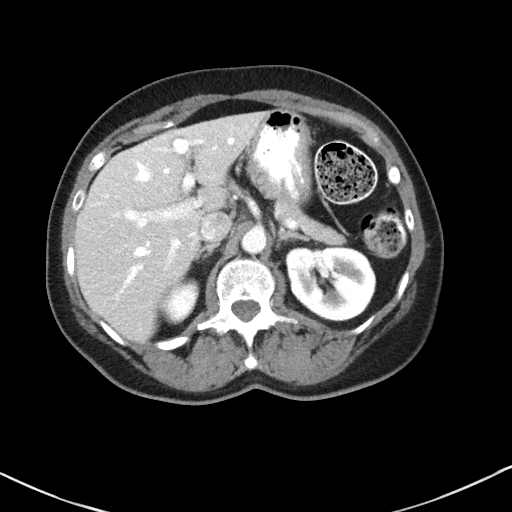
[im 75/84  soft-tissue]
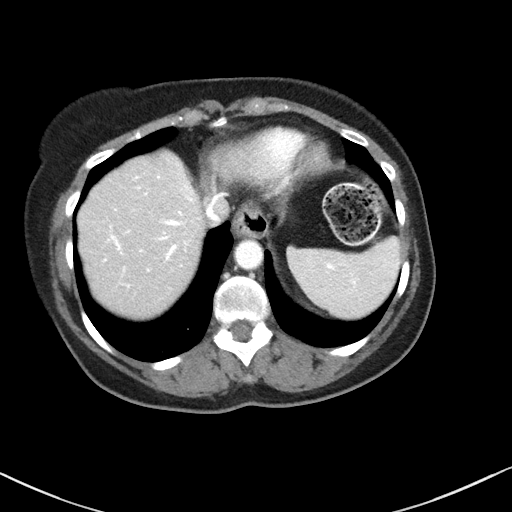
[im 79/84  soft-tissue]
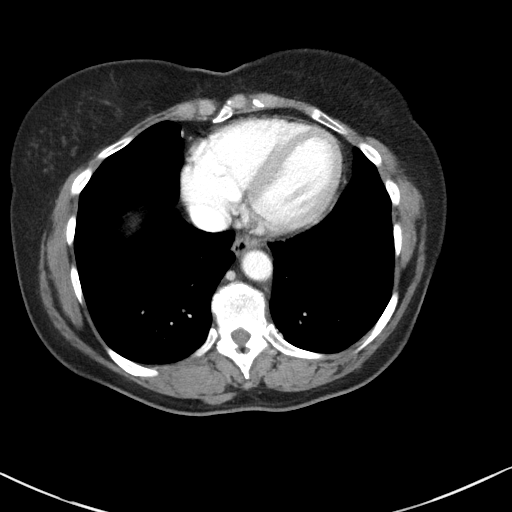

[Series 6: coronal st · coronal · 0.73mm/px · 3 of 101 slices shown]
[im 34/101  soft-tissue]
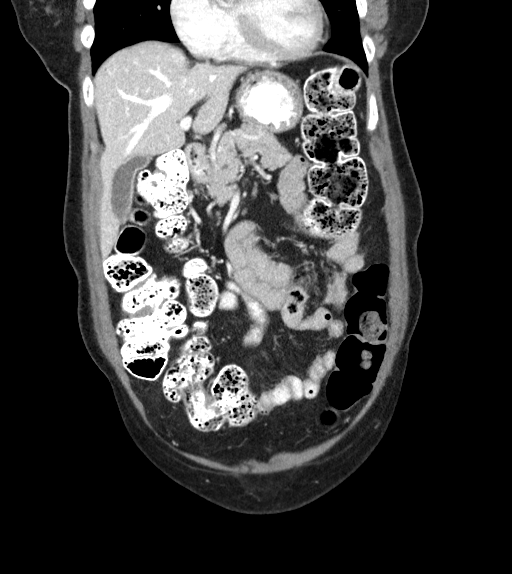
[im 45/101  soft-tissue]
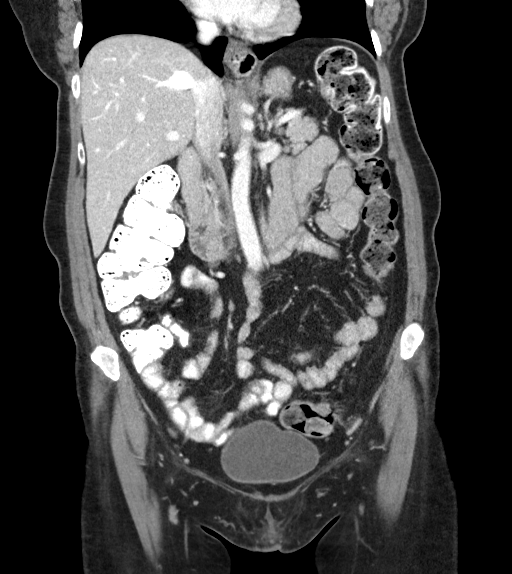
[im 56/101  soft-tissue]
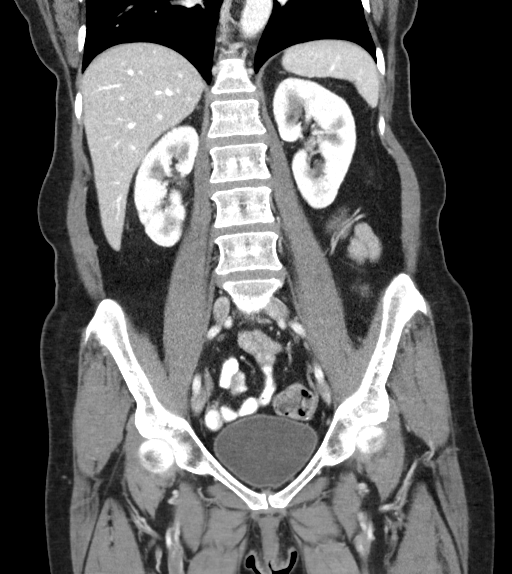

[17 of 46 positions shown; findings below may reference images not displayed]

FINDINGS: Lower Chest: No acute findings.

Hepatobiliary: No hepatic masses identified. Gallbladder is
unremarkable. No evidence of biliary ductal dilatation.

Pancreas:  No mass or inflammatory changes.

Spleen: Within normal limits in size and appearance.

Adrenals/Urinary Tract: No masses identified. A few tiny sub-cm left
renal cysts are noted. No evidence of ureteral calculi or
hydronephrosis.

Stomach/Bowel: Tiny hiatal hernia noted. No evidence of obstruction,
inflammatory process or abnormal fluid collections.

Vascular/Lymphatic: No pathologically enlarged lymph nodes. No
abdominal aortic aneurysm.

Reproductive:  No mass or other significant abnormality.

Other:  None.

Musculoskeletal:  No suspicious bone lesions identified.
IMPRESSION: No acute findings within the abdomen or pelvis.

Tiny hiatal hernia.

## 2020-05-31 MED ORDER — IOHEXOL 300 MG/ML  SOLN
100.0000 mL | Freq: Once | INTRAMUSCULAR | Status: AC | PRN
  Administered 2020-05-31: 100 mL via INTRAVENOUS

## 2020-06-24 DIAGNOSIS — Z6822 Body mass index (BMI) 22.0-22.9, adult: Secondary | ICD-10-CM | POA: Diagnosis not present

## 2020-06-24 DIAGNOSIS — Z1331 Encounter for screening for depression: Secondary | ICD-10-CM | POA: Diagnosis not present

## 2020-06-24 DIAGNOSIS — Z0000 Encounter for general adult medical examination without abnormal findings: Secondary | ICD-10-CM | POA: Diagnosis not present

## 2020-06-24 DIAGNOSIS — I10 Essential (primary) hypertension: Secondary | ICD-10-CM | POA: Diagnosis not present

## 2020-06-24 DIAGNOSIS — R748 Abnormal levels of other serum enzymes: Secondary | ICD-10-CM | POA: Diagnosis not present

## 2020-06-29 ENCOUNTER — Other Ambulatory Visit (HOSPITAL_COMMUNITY): Payer: Self-pay | Admitting: Family Medicine

## 2020-06-29 DIAGNOSIS — E2839 Other primary ovarian failure: Secondary | ICD-10-CM

## 2020-07-07 DIAGNOSIS — Z6822 Body mass index (BMI) 22.0-22.9, adult: Secondary | ICD-10-CM | POA: Diagnosis not present

## 2020-07-07 DIAGNOSIS — I10 Essential (primary) hypertension: Secondary | ICD-10-CM | POA: Diagnosis not present

## 2020-07-07 DIAGNOSIS — E785 Hyperlipidemia, unspecified: Secondary | ICD-10-CM | POA: Diagnosis not present

## 2020-07-07 DIAGNOSIS — Z0000 Encounter for general adult medical examination without abnormal findings: Secondary | ICD-10-CM | POA: Diagnosis not present

## 2020-07-07 DIAGNOSIS — Z1389 Encounter for screening for other disorder: Secondary | ICD-10-CM | POA: Diagnosis not present

## 2020-08-25 DIAGNOSIS — Z1389 Encounter for screening for other disorder: Secondary | ICD-10-CM | POA: Diagnosis not present

## 2020-08-25 DIAGNOSIS — Z6822 Body mass index (BMI) 22.0-22.9, adult: Secondary | ICD-10-CM | POA: Diagnosis not present

## 2020-08-25 DIAGNOSIS — R42 Dizziness and giddiness: Secondary | ICD-10-CM | POA: Diagnosis not present

## 2020-08-25 DIAGNOSIS — H6122 Impacted cerumen, left ear: Secondary | ICD-10-CM | POA: Diagnosis not present

## 2020-08-25 DIAGNOSIS — H6990 Unspecified Eustachian tube disorder, unspecified ear: Secondary | ICD-10-CM | POA: Diagnosis not present

## 2020-09-19 DIAGNOSIS — H903 Sensorineural hearing loss, bilateral: Secondary | ICD-10-CM | POA: Diagnosis not present

## 2020-09-19 DIAGNOSIS — R42 Dizziness and giddiness: Secondary | ICD-10-CM | POA: Diagnosis not present

## 2020-09-19 DIAGNOSIS — H838X3 Other specified diseases of inner ear, bilateral: Secondary | ICD-10-CM | POA: Diagnosis not present

## 2020-10-03 ENCOUNTER — Ambulatory Visit (HOSPITAL_COMMUNITY)
Admission: RE | Admit: 2020-10-03 | Discharge: 2020-10-03 | Disposition: A | Discharge status: Home / Self Care | Payer: Medicare Other | Source: Ambulatory Visit | Attending: Family Medicine | Admitting: Family Medicine

## 2020-10-03 ENCOUNTER — Other Ambulatory Visit: Payer: Self-pay

## 2020-10-03 DIAGNOSIS — R42 Dizziness and giddiness: Secondary | ICD-10-CM | POA: Diagnosis not present

## 2020-10-03 DIAGNOSIS — Z780 Asymptomatic menopausal state: Secondary | ICD-10-CM | POA: Diagnosis not present

## 2020-10-03 DIAGNOSIS — M8589 Other specified disorders of bone density and structure, multiple sites: Secondary | ICD-10-CM | POA: Diagnosis not present

## 2020-10-03 DIAGNOSIS — E2839 Other primary ovarian failure: Secondary | ICD-10-CM | POA: Diagnosis not present

## 2020-10-10 DIAGNOSIS — H903 Sensorineural hearing loss, bilateral: Secondary | ICD-10-CM | POA: Diagnosis not present

## 2020-10-10 DIAGNOSIS — H832X2 Labyrinthine dysfunction, left ear: Secondary | ICD-10-CM | POA: Diagnosis not present

## 2020-10-10 DIAGNOSIS — R42 Dizziness and giddiness: Secondary | ICD-10-CM | POA: Diagnosis not present

## 2020-10-11 ENCOUNTER — Other Ambulatory Visit: Payer: Self-pay | Admitting: Otolaryngology

## 2020-10-11 ENCOUNTER — Other Ambulatory Visit (HOSPITAL_COMMUNITY): Payer: Self-pay | Admitting: Otolaryngology

## 2020-10-11 DIAGNOSIS — H9042 Sensorineural hearing loss, unilateral, left ear, with unrestricted hearing on the contralateral side: Secondary | ICD-10-CM

## 2020-10-25 ENCOUNTER — Other Ambulatory Visit: Payer: Self-pay

## 2020-10-25 ENCOUNTER — Ambulatory Visit (HOSPITAL_COMMUNITY)
Admission: RE | Admit: 2020-10-25 | Discharge: 2020-10-25 | Disposition: A | Discharge status: Home / Self Care | Payer: Medicare Other | Source: Ambulatory Visit | Attending: Otolaryngology | Admitting: Otolaryngology

## 2020-10-25 DIAGNOSIS — H9190 Unspecified hearing loss, unspecified ear: Secondary | ICD-10-CM | POA: Diagnosis not present

## 2020-10-25 DIAGNOSIS — H9042 Sensorineural hearing loss, unilateral, left ear, with unrestricted hearing on the contralateral side: Secondary | ICD-10-CM | POA: Diagnosis not present

## 2020-10-25 IMAGING — MR MR HEAD WO/W CM
19 of 21 series · 32 of 48 positions shown · IV contrast (gadavist)
Comparison: [DATE]

CLINICAL DATA: Left hearing loss

EXAM:
MRI HEAD WITHOUT AND WITH CONTRAST
TECHNIQUE: Multiplanar, multiecho pulse sequences of the brain and surrounding
structures were obtained without and with intravenous contrast.
CONTRAST:  5.5mL GADAVIST GADOBUTROL 1 MMOL/ML IV SOLN

[Series 5: DWI · axial · 3.0mm · 0.77mm/px · z∈[-55,+74]mm · 3 of 44 slices shown (1 of 6)]
[im 1/44]
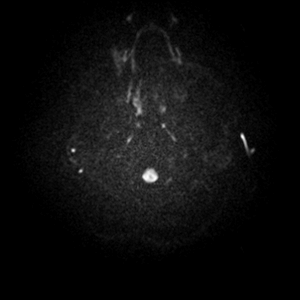
[im 22/44]
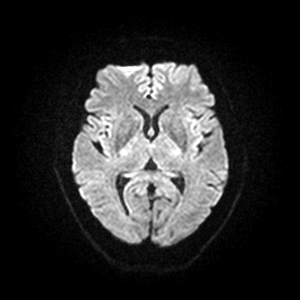
[im 44/44]
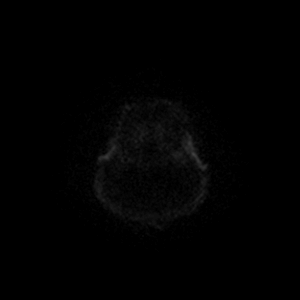

[Series 5: DWI · axial · 3.0mm · 0.77mm/px · z∈[-55,+74]mm · 3 of 44 slices shown (2 of 6)]
[im 1/44]
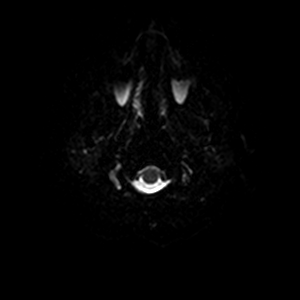
[im 22/44]
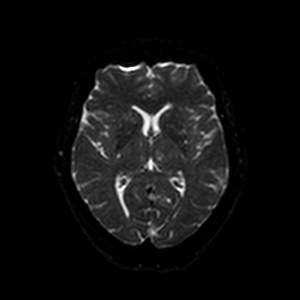
[im 44/44]
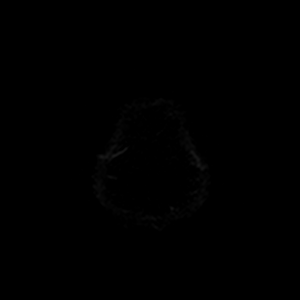

[Series 6: DWI · axial · 3.0mm · 0.77mm/px · z∈[-55,+74]mm · 2 of 44 slices shown (3 of 6)]
[im 1/44]
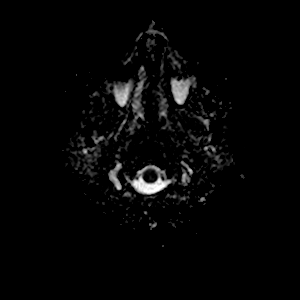
[im 44/44]
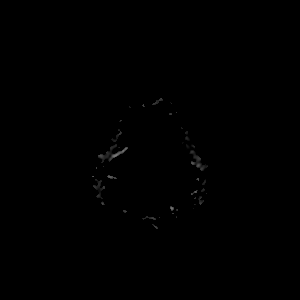

[Series 7: DWI · coronal · 5.0mm · 0.88mm/px · 1 of 28 slices shown (4 of 6)]
[im 1/28]
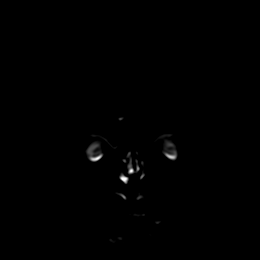

[Series 7: DWI · coronal · 5.0mm · 0.88mm/px · 1 of 28 slices shown (5 of 6)]
[im 1/28]
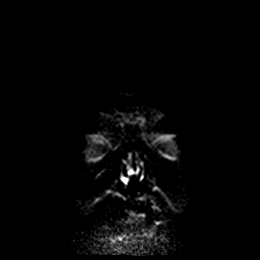

[Series 8: DWI · coronal · 5.0mm · 0.88mm/px · 1 of 28 slices shown (6 of 6)]
[im 1/28]
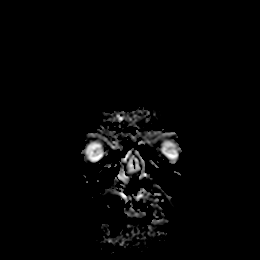

[Series 9: T1 · sagittal · 5.0mm · 0.75mm/px · 1 of 19 slices shown]
[im 1/19]
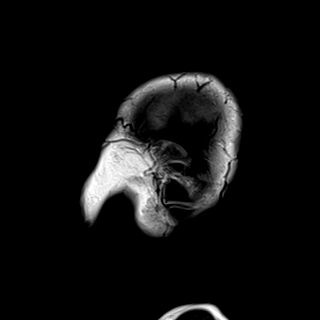

[Series 10: T2 · axial · 5.0mm · 0.72mm/px · 1 of 20 slices shown]
[im 1/20]
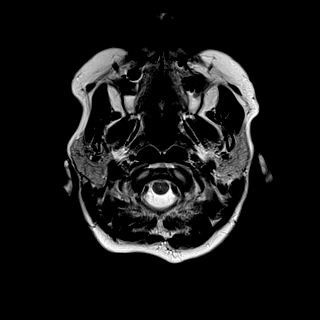

[Series 11: mag_images · axial · 3.0mm · 0.90mm/px · z∈[-86,+90]mm · 3 of 60 slices shown]
[im 1/60]
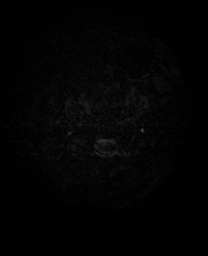
[im 30/60]
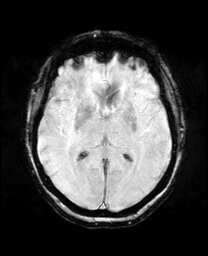
[im 60/60]
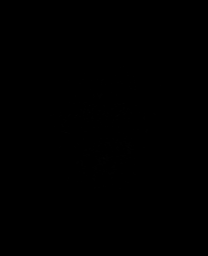

[Series 12: pha_images · axial · 3.0mm · 0.90mm/px · z∈[-83,+87]mm · 3 of 57 slices shown]
[im 1/57]
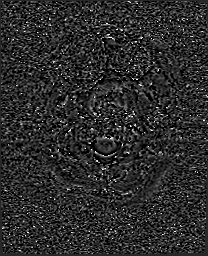
[im 29/57]
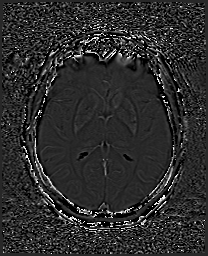
[im 57/57]
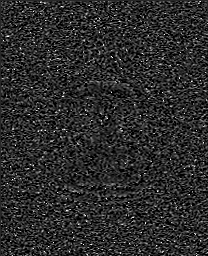

[Series 13: swi_images · axial · 3.0mm · 0.90mm/px · z∈[-86,+90]mm · 3 of 60 slices shown]
[im 1/60]
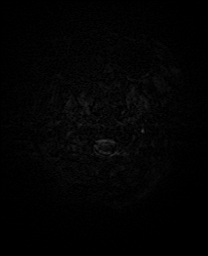
[im 30/60]
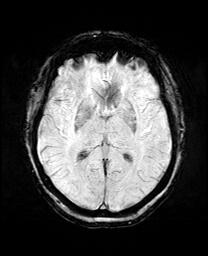
[im 60/60]
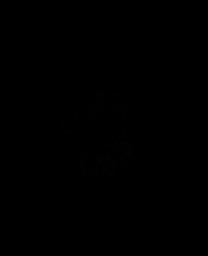

[Series 15: FLAIR · axial · 5.0mm · 0.45mm/px · 1 of 20 slices shown]
[im 1/20]
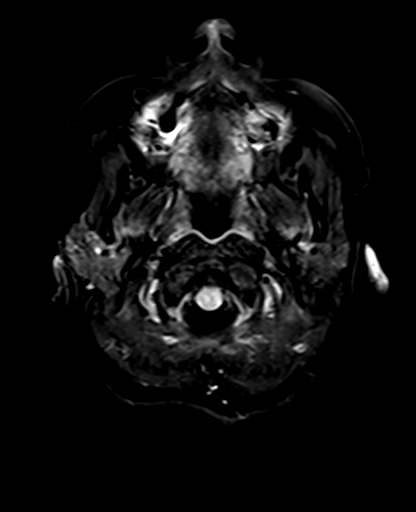

[Series 17: t1_tse_r_cor_3mm · coronal · 3.0mm · 0.33mm/px · 1 of 13 slices shown (1 of 2)]
[im 1/13]
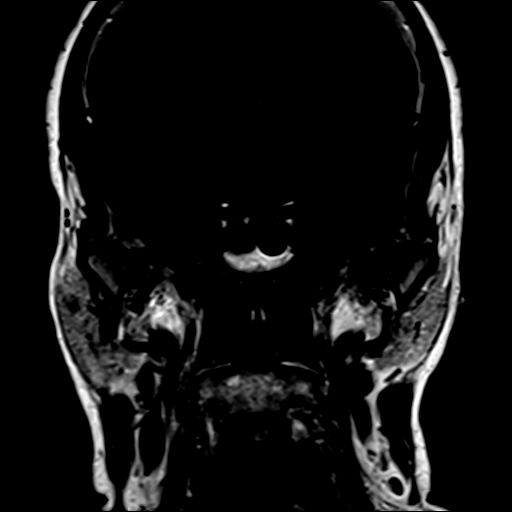

[Series 18: t2_space_tra_p2_iso · axial · 0.6mm · 0.30mm/px · z∈[-77,-42]mm · 3 of 64 slices shown]
[im 1/64]
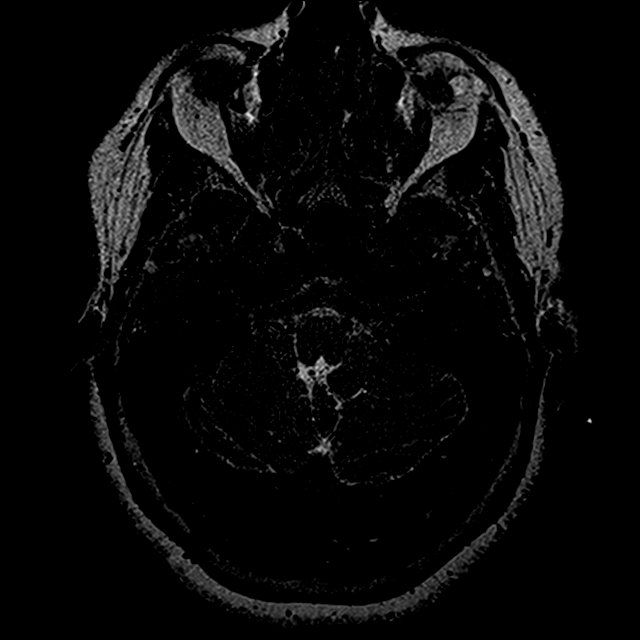
[im 32/64]
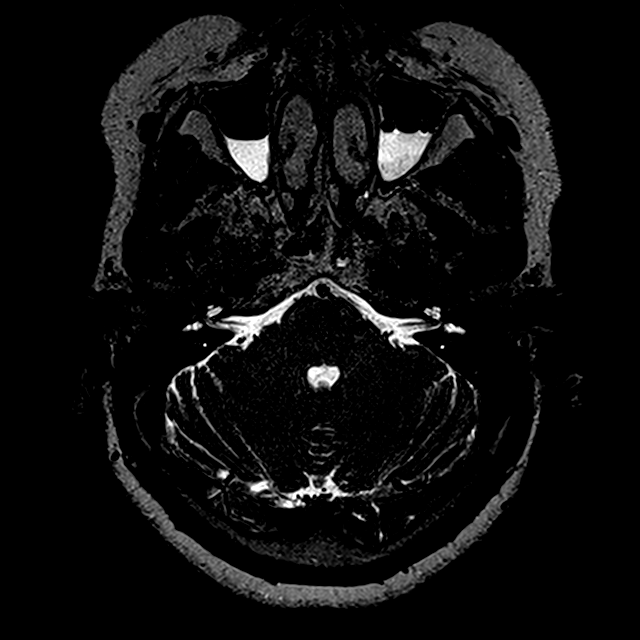
[im 64/64]
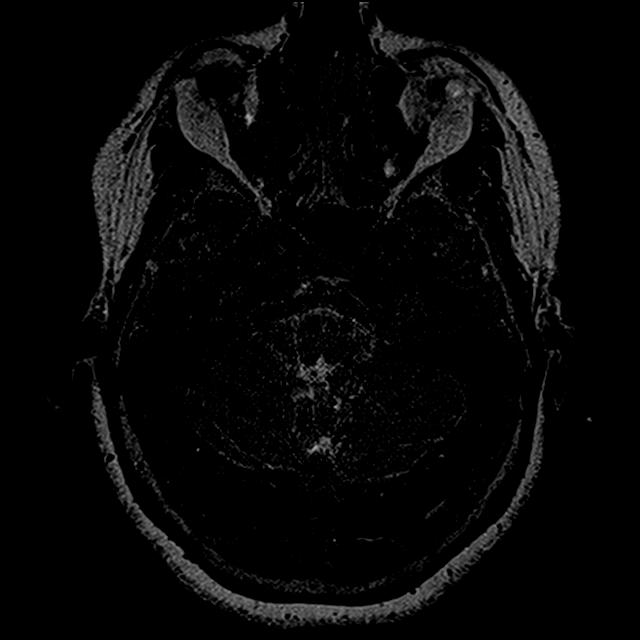

[Series 19: t1_tse_tra_3mm · axial · 3.0mm · 0.31mm/px · 1 of 15 slices shown (1 of 2)]
[im 1/15]
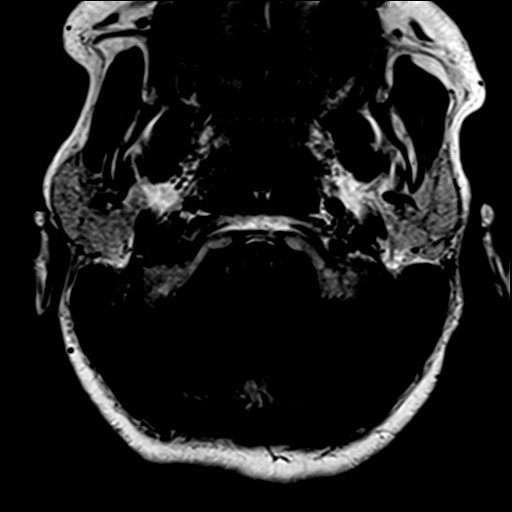

[Series 20: T2 post-contrast · coronal · 5.0mm · 0.72mm/px · 1 of 28 slices shown]
[im 1/28]
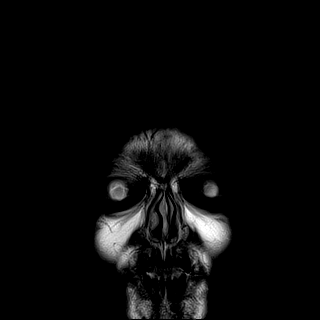

[Series 21: t1_tse_tra_3mm · axial · 3.0mm · 0.37mm/px · 1 of 15 slices shown (2 of 2)]
[im 1/15]
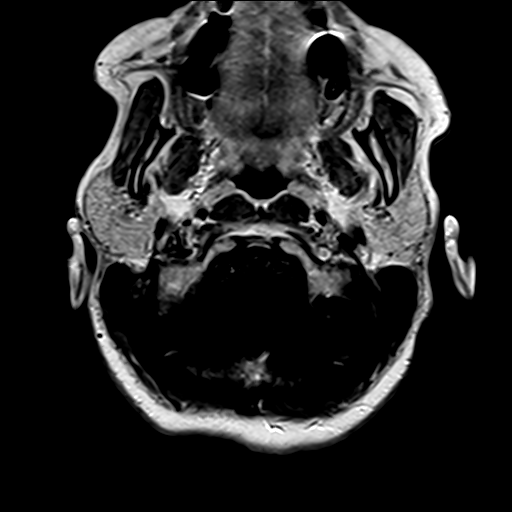

[Series 22: t1_tse_r_cor_3mm · coronal · 3.0mm · 0.33mm/px · 1 of 13 slices shown (2 of 2)]
[im 1/13]
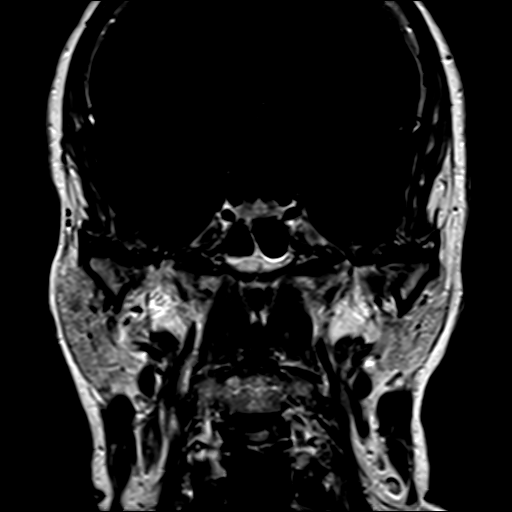

[Series 24: T1 post-contrast · coronal · 5.0mm · 0.34mm/px · 1 of 28 slices shown]
[im 1/28]
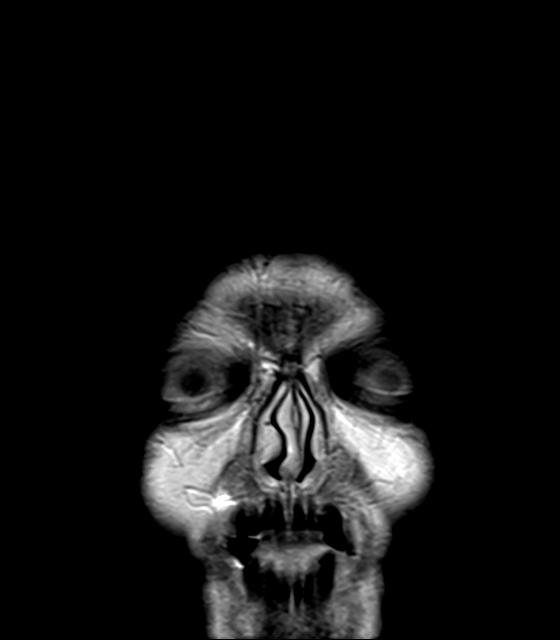

[32 of 48 positions shown; findings below may reference images not displayed]

FINDINGS: Brain: There is no cerebellopontine angle mass. Inner ear structures
demonstrate an unremarkable MR appearance. There is no abnormal
enhancement within the internal auditory canals.

No acute infarction or intracranial hemorrhage. There is no mass
effect or edema. There is no extra-axial fluid collection.
Ventricles and sulci are normal in size and configuration. Minimal
foci of T2 hyperintensity in the supratentorial white matter are
nonspecific but may reflect minor chronic microvascular ischemic
changes.

Vascular: Major vessel flow voids at the skull base are preserved.

Skull and upper cervical spine: Normal marrow signal is preserved.

Sinuses/Orbits: Paranasal sinus mucosal thickening with maxillary
sinus air-fluid levels. The orbits are unremarkable.

Other: The sella is unremarkable.  Mastoid air cells are clear.
IMPRESSION: No mass or abnormal enhancement.

Bilateral maxillary sinus air-fluid levels, nonspecific but can
reflect acute sinusitis in the appropriate clinical setting.

## 2020-10-25 MED ORDER — GADOBUTROL 1 MMOL/ML IV SOLN
5.5000 mL | Freq: Once | INTRAVENOUS | Status: AC | PRN
  Administered 2020-10-25: 5.5 mL via INTRAVENOUS

## 2020-11-01 ENCOUNTER — Other Ambulatory Visit: Payer: Self-pay

## 2020-11-01 ENCOUNTER — Ambulatory Visit (HOSPITAL_COMMUNITY): Payer: Medicare Other | Attending: Otolaryngology | Admitting: Physical Therapy

## 2020-11-01 ENCOUNTER — Encounter (HOSPITAL_COMMUNITY): Payer: Self-pay | Admitting: Physical Therapy

## 2020-11-01 DIAGNOSIS — R262 Difficulty in walking, not elsewhere classified: Secondary | ICD-10-CM | POA: Insufficient documentation

## 2020-11-01 DIAGNOSIS — M6281 Muscle weakness (generalized): Secondary | ICD-10-CM | POA: Diagnosis not present

## 2020-11-01 NOTE — Patient Instructions (Signed)
Access Code: [email or web address] URL: [email or web address] Date: 11/01/2020 Prepared by: Revonda Humphrey  Exercises Active Straight Leg Raise with Quad Set - 4 sets - 5 reps Supine Bridge - 4 sets - 5 reps

## 2020-11-01 NOTE — Therapy (Signed)
[facility] [address] [place], [place], [address] Phone: [phone]   Fax:  [phone]  Physical Therapy Evaluation  Patient Details  Name: Kristy Oneal MRN: [id number] Date of Birth: December 25, 1950 Referring Provider (PT): Newman Pies MD   Encounter Date: 11/01/2020   PT End of Session - 11/01/20 1601    Visit Number 1    Number of Visits 8    Date for PT Re-Evaluation 11/29/20    Authorization Type medicare, secondary ceneric commercial, no auth    Progress Note Due on Visit 10    PT Start Time 1603    PT Stop Time 1638    PT Time Calculation (min) 35 min    Activity Tolerance Patient tolerated treatment well    Behavior During Therapy [facility] for tasks assessed/performed           Past Medical History:  Diagnosis Date  . Abnormal Pap smear   . History of abnormal cervical Pap smear 03/22/2014  . Hypertension   . Rectocele, female 03/22/2014  . Vaginal Pap smear, abnormal     Past Surgical History:  Procedure Laterality Date  . BREAST SURGERY     fibroid removed  . COLONOSCOPY N/A 02/06/2017   Recto-sigmoid colon and sigmoid moderately redundant. Internal hemorrhoids.   . ENDOMETRIAL ABLATION    . tubal liagtion    . TUBAL LIGATION      There were no vitals filed for this visit.    Subjective Assessment - 11/01/20 1641    Subjective States that when she walks she goes to the right. States her balance is off. States that her balance is off but she is not dizzy. States she had a vertigo attack last May and everything is better but she still sways to the right when she walks. States she lost some hearing in her left ear after the vertigo attack. Reports imaging was negative. No other balance issues noted. Reports she had an eye appointment prior to her vertigo attack but has not been back since.    Patient Stated Goals to be able to walk without deviation to the right    Currently in Pain? No/denies              [facility] PT  Assessment - 11/01/20 0001      Assessment   Medical Diagnosis dizziness    Referring Provider (PT) Newman Pies MD      Balance Screen   Has the patient fallen in the past 6 months No      ROM / Strength   AROM / PROM / Strength Strength      Strength   Strength Assessment Site Hip;Knee;Ankle    Right/Left Hip Right;Left    Right Hip Flexion 4+/5    Right Hip Extension 3+/5    Right Hip ABduction 4-/5    Left Hip Flexion 4+/5    Left Hip Extension 3/5    Left Hip ABduction 3+/5    Right/Left Knee Right;Left    Right Knee Flexion 4+/5    Right Knee Extension 4+/5    Left Knee Flexion 4-/5    Left Knee Extension 4/5    Right/Left Ankle Right;Left    Right Ankle Dorsiflexion 5/5    Right Ankle Plantar Flexion 4/5    Left Ankle Dorsiflexion 5/5    Left Ankle Plantar Flexion 4-/5      Balance   Balance Assessed Yes      Static Standing Balance  Static Standing - Balance Support No upper extremity supported    Static Standing - Level of Assistance 6: Modified independent (Device/Increase time)    Static Standing Balance -  Activities  Single Leg Stance - Right Leg;Single Leg Stance - Left Leg;Tandam Stance - Right Leg;Tandam Stance - Left Leg    Static Standing - Comment/# of Minutes on floor: SLS L 18 seconds, R 12 seconds, tandem right posterior 21, left posterior 30 seconds      Standardized Balance Assessment   Standardized Balance Assessment Dynamic Gait Index      Dynamic Gait Index   Level Surface Normal    Change in Gait Speed Normal    Gait with Horizontal Head Turns Mild Impairment    Gait with Vertical Head Turns Normal    Gait and Pivot Turn Normal    Step Over Obstacle Normal    Step Around Obstacles Normal    Steps Normal    Total Score 23                  Vestibular Assessment - 11/01/20 0001      Oculomotor Exam   Smooth Pursuits Intact   reports strain on eyes when looking to right   Saccades Intact   reports strain on eyes              Objective measurements completed on examination: See above findings.       [facility] Adult PT Treatment/Exercise - 11/01/20 0001      Exercises   Exercises Lumbar      Lumbar Exercises: Supine   Bridge 5 reps;5 seconds   with focus on left side   Straight Leg Raise 5 reps   bilateral with 2 second hold at top                 PT Education - 11/01/20 1639    Education Details on current presentation, on HEP, plan moving forward    Person(s) Educated Patient    Methods Explanation    Comprehension Verbalized understanding            PT Short Term Goals - 11/01/20 1642      PT SHORT TERM GOAL #1   Title Patient will report at least 50% improvement in overall symptoms and/or function to demonstrate improved functional mobility    Time 2    Period Weeks    Status New    Target Date 11/15/20      PT SHORT TERM GOAL #2   Title Patient will be independent in self management strategies to improve quality of life and functional outcomes.    Time 2    Period Weeks    Status New    Target Date 11/15/20             PT Long Term Goals - 11/01/20 1642      PT LONG TERM GOAL #1   Title Patient will report at least 50% improvement in overall symptoms and/or function to demonstrate improved functional mobility    Time 4    Period Weeks    Status New    Target Date 11/29/20      PT LONG TERM GOAL #2   Title Patient will be able to walk down hall without deviating to the right to demonstrate improved ambulatory mobility    Time 4    Period Weeks    Status New    Target Date 11/29/20      PT  LONG TERM GOAL #3   Title Patient will score with perfect DGI score to demonstrate improved dynamic balance    Time 4    Period Weeks    Status New    Target Date 11/29/20                  Plan - 11/01/20 1643    Clinical Impression Statement Patient presents to therapy with primary complaint of deviating to the right with walking after a vertigo attack  last May. Reports everything but this symptom has improved. Weakness noted in left lower extremity compared to right and eye strain noted when looking down to the right. Educated patient in current findings and possible weakness contributing to current presentation. Patient has had imaging down at the time of the vertigo episode and recently with MD who referred her here with no significant findings.  Encouraged patient to follow up with eye doctor as she is due for her annual check up and it would be good to rule out any eye involvement follow the vertigo episode. Patient would benefit from skilled physical therapy to improve overall function and return her to optimal function.    Examination-Activity Limitations Locomotion Level    Examination-Participation Restrictions Community Activity    Stability/Clinical Decision Making Stable/Uncomplicated    Clinical Decision Making Low    Rehab Potential Good    PT Frequency 2x / week    PT Duration 4 weeks    PT Treatment/Interventions ADLs/Self Care Home Management;Biofeedback;Gait training;Functional mobility training;Therapeutic activities;Therapeutic exercise;Balance training;Manual techniques;Patient/family education;Neuromuscular re-education    PT Next Visit Plan L LE strengthening, dynamic balance, gait with push off and increasing left LE use    PT Home Exercise Plan bridges, SLR           Patient will benefit from skilled therapeutic intervention in order to improve the following deficits and impairments:  Decreased balance,Difficulty walking,Decreased strength  Visit Diagnosis: Muscle weakness (generalized)  Difficulty in walking, not elsewhere classified     Problem List Patient Active Problem List   Diagnosis Date Noted  . Elevated lipase 05/27/2020  . Early satiety 05/27/2020  . Encounter for screening colonoscopy   . Rectocele, female 03/22/2014  . History of abnormal cervical Pap smear 03/22/2014  . Mild dysplasia of  cervix 04/06/2013  . PUD 10/29/2008  . CARPAL TUNNEL SYNDROME 06/05/2006  . HYPERTENSION 06/05/2006  . ACNE NEC 06/05/2006   4:51 PM, 11/01/20 Tereasa Coop, DPT Physical Therapy with [facility]  [phone] office  [facility] [address] [place], [place], [address] Phone: [phone]   Fax:  [phone]  Name: Kristy Oneal MRN: [id number] Date of Birth: [birth year]

## 2020-11-08 ENCOUNTER — Ambulatory Visit (HOSPITAL_COMMUNITY): Payer: Medicare Other | Attending: Otolaryngology

## 2020-11-08 ENCOUNTER — Encounter (HOSPITAL_COMMUNITY): Payer: Self-pay

## 2020-11-08 ENCOUNTER — Other Ambulatory Visit: Payer: Self-pay

## 2020-11-08 DIAGNOSIS — M6281 Muscle weakness (generalized): Secondary | ICD-10-CM | POA: Insufficient documentation

## 2020-11-08 DIAGNOSIS — R262 Difficulty in walking, not elsewhere classified: Secondary | ICD-10-CM | POA: Insufficient documentation

## 2020-11-08 NOTE — Therapy (Signed)
[facility] [address] [place], [place], [address] Phone: [phone]   Fax:  [phone]  Physical Therapy Treatment  Patient Details  Name: Kristy Oneal MRN: [id number] Date of Birth: 1951/01/30 Referring Provider (PT): Newman Pies MD   Encounter Date: 11/08/2020   PT End of Session - 11/08/20 1701    Visit Number 2    Number of Visits 8    Date for PT Re-Evaluation 11/29/20    Authorization Type medicare, secondary ceneric commercial, no auth    Progress Note Due on Visit 10    PT Start Time 1700    PT Stop Time 1740    PT Time Calculation (min) 40 min    Activity Tolerance Patient tolerated treatment well    Behavior During Therapy [facility] for tasks assessed/performed           Past Medical History:  Diagnosis Date  . Abnormal Pap smear   . History of abnormal cervical Pap smear 03/22/2014  . Hypertension   . Rectocele, female 03/22/2014  . Vaginal Pap smear, abnormal     Past Surgical History:  Procedure Laterality Date  . BREAST SURGERY     fibroid removed  . COLONOSCOPY N/A 02/06/2017   Recto-sigmoid colon and sigmoid moderately redundant. Internal hemorrhoids.   . ENDOMETRIAL ABLATION    . tubal liagtion    . TUBAL LIGATION      There were no vitals filed for this visit.   Subjective Assessment - 11/08/20 1700    Subjective Pt stated she is feeling good.  No reports of dizziness episodes or recent falls.  Reports she has began HEP daily without questions.    Patient Stated Goals to be able to walk without deviation to the right    Currently in Pain? No/denies                             [facility] Adult PT Treatment/Exercise - 11/08/20 0001      Exercises   Exercises Lumbar      Lumbar Exercises: Standing   Heel Raises 10 reps    Heel Raises Limitations 3-5" holds wiht minimal HHA; Toe raise on incline slope with HHA      Lumbar Exercises: Seated   Sit to Stand 10 reps    Sit to Stand  Limitations eccentric control      Lumbar Exercises: Supine   Bridge 10 reps;5 seconds    Straight Leg Raise 10 reps;2 seconds      Lumbar Exercises: Sidelying   Hip Abduction Both;5 reps    Hip Abduction Limitations 2 sets; cueing for form      Lumbar Exercises: Prone   Straight Leg Raise 5 reps;2 seconds    Other Prone Lumbar Exercises knee flexion 10x               Balance Exercises - 11/08/20 0001      Balance Exercises: Standing   Tandem Stance 2 reps;30 secs    Sidestepping 2 reps    Marching Solid surface;10 reps   no HHA, alternating, 5" holds            PT Education - 11/08/20 1705    Education Details Reviewed goals, educated importance of HEP complinace for maximal benefits, pt able to recall bridges though required cueing for form with SLR.    Person(s) Educated Patient    Methods Explanation;Demonstration;Verbal cues  Comprehension Verbalized understanding;Returned demonstration            PT Short Term Goals - 11/01/20 1642      PT SHORT TERM GOAL #1   Title Patient will report at least 50% improvement in overall symptoms and/or function to demonstrate improved functional mobility    Time 2    Period Weeks    Status New    Target Date 11/15/20      PT SHORT TERM GOAL #2   Title Patient will be independent in self management strategies to improve quality of life and functional outcomes.    Time 2    Period Weeks    Status New    Target Date 11/15/20             PT Long Term Goals - 11/01/20 1642      PT LONG TERM GOAL #1   Title Patient will report at least 50% improvement in overall symptoms and/or function to demonstrate improved functional mobility    Time 4    Period Weeks    Status New    Target Date 11/29/20      PT LONG TERM GOAL #2   Title Patient will be able to walk down hall without deviating to the right to demonstrate improved ambulatory mobility    Time 4    Period Weeks    Status New    Target Date 11/29/20       PT LONG TERM GOAL #3   Title Patient will score with perfect DGI score to demonstrate improved dynamic balance    Time 4    Period Weeks    Status New    Target Date 11/29/20                 Plan - 11/08/20 1708    Clinical Impression Statement Reviewed goals, educated importance of HEP complinace for maximal benefits, pt able to recall bridges though required cueing for form with SLR.  Session focus on hip/LE strengthening and balance training.  Multimodal cueing for proper form and mechanics wiht all exercises.  No reports of pain, was limited by fatgiue Lt LE> Rt LE following 5 reps and required short duration rest breaks between exercise.  SBA with balance based activities, cueing for visual focal point assisted with tandem stance.    Examination-Activity Limitations Locomotion Level    Examination-Participation Restrictions Community Activity    Stability/Clinical Decision Making Stable/Uncomplicated    Clinical Decision Making Low    Rehab Potential Good    PT Frequency 2x / week    PT Duration 4 weeks    PT Treatment/Interventions ADLs/Self Care Home Management;Biofeedback;Gait training;Functional mobility training;Therapeutic activities;Therapeutic exercise;Balance training;Manual techniques;Patient/family education;Neuromuscular re-education    PT Next Visit Plan L LE strengthening, dynamic balance, gait with push off and increasing left LE use    PT Home Exercise Plan bridges, SLR; 5/18: hip abduction and STS    Consulted and Agree with Plan of Care Patient           Patient will benefit from skilled therapeutic intervention in order to improve the following deficits and impairments:  Decreased balance,Difficulty walking,Decreased strength  Visit Diagnosis: Muscle weakness (generalized)  Difficulty in walking, not elsewhere classified     Problem List Patient Active Problem List   Diagnosis Date Noted  . Elevated lipase 05/27/2020  . Early satiety  05/27/2020  . Encounter for screening colonoscopy   . Rectocele, female 03/22/2014  . History of abnormal cervical  Pap smear 03/22/2014  . Mild dysplasia of cervix 04/06/2013  . PUD 10/29/2008  . CARPAL TUNNEL SYNDROME 06/05/2006  . HYPERTENSION 06/05/2006  . ACNE NEC 06/05/2006   Becky Sax, LPTA/CLT; CBIS [phone]  Juel Burrow 11/08/2020, 5:49 PM  [facility] [address] [place], [place], [address] Phone: [phone]   Fax:  [phone]  Name: Kristy Oneal MRN: [id number] Date of Birth: 1950-11-18

## 2020-11-09 ENCOUNTER — Encounter (HOSPITAL_COMMUNITY): Payer: Self-pay

## 2020-11-09 ENCOUNTER — Ambulatory Visit (HOSPITAL_COMMUNITY): Payer: Medicare Other

## 2020-11-09 DIAGNOSIS — R262 Difficulty in walking, not elsewhere classified: Secondary | ICD-10-CM | POA: Diagnosis not present

## 2020-11-09 DIAGNOSIS — M6281 Muscle weakness (generalized): Secondary | ICD-10-CM

## 2020-11-09 NOTE — Therapy (Signed)
[facility] [address] [place], [place], [address] Phone: [phone]   Fax:  [phone]  Physical Therapy Treatment  Patient Details  Name: Kristy Oneal MRN: [id number] Date of Birth: [birth year] Referring Provider (PT): Newman Pies MD   Encounter Date: 11/09/2020   PT End of Session - 11/09/20 1705    Visit Number 3    Number of Visits 8    Date for PT Re-Evaluation 11/29/20    Authorization Type medicare, secondary ceneric commercial, no auth    Progress Note Due on Visit 10    PT Start Time 1700    PT Stop Time 1743    PT Time Calculation (min) 43 min    Activity Tolerance Patient tolerated treatment well    Behavior During Therapy [facility] for tasks assessed/performed           Past Medical History:  Diagnosis Date  . Abnormal Pap smear   . History of abnormal cervical Pap smear 03/22/2014  . Hypertension   . Rectocele, female 03/22/2014  . Vaginal Pap smear, abnormal     Past Surgical History:  Procedure Laterality Date  . BREAST SURGERY     fibroid removed  . COLONOSCOPY N/A 02/06/2017   Recto-sigmoid colon and sigmoid moderately redundant. Internal hemorrhoids.   . ENDOMETRIAL ABLATION    . tubal liagtion    . TUBAL LIGATION      There were no vitals filed for this visit.   Subjective Assessment - 11/09/20 1703    Subjective Feeling good today.  No reports of dizziness.  Has not began newest exercises as was given yesterday but does recall the exercises.    Patient Stated Goals to be able to walk without deviation to the right    Currently in Pain? No/denies                             [facility] Adult PT Treatment/Exercise - 11/09/20 0001      Lumbar Exercises: Seated   Sit to Stand 10 reps    Sit to Stand Limitations eccentric control      Lumbar Exercises: Supine   Bridge 10 reps;5 seconds    Bridge Limitations reviewed HEP    Straight Leg Raise 10 reps    Straight Leg Raises Limitations  reviewed HEP      Lumbar Exercises: Sidelying   Hip Abduction Both;10 reps    Hip Abduction Limitations reviewed HEP               Balance Exercises - 11/09/20 0001      Balance Exercises: Standing   Tandem Stance 3 reps;Eyes open   1 set static; 1 sets with head turns; last set on foam   SLS 3 reps   Rt 8", Lt 18"   SLS with Vectors 2 reps;10 secs   no HHA BLE   Balance Beam Tandem and sidestep 2RT    Sidestepping 2 reps;Theraband   RTB around thigh   Marching Solid surface;10 reps   no HHA   Other Standing Exercises DGI activiies x 450 ft               PT Short Term Goals - 11/01/20 1642      PT SHORT TERM GOAL #1   Title Patient will report at least 50% improvement in overall symptoms and/or function to demonstrate improved functional mobility    Time 2  Period Weeks    Status New    Target Date 11/15/20      PT SHORT TERM GOAL #2   Title Patient will be independent in self management strategies to improve quality of life and functional outcomes.    Time 2    Period Weeks    Status New    Target Date 11/15/20             PT Long Term Goals - 11/01/20 1642      PT LONG TERM GOAL #1   Title Patient will report at least 50% improvement in overall symptoms and/or function to demonstrate improved functional mobility    Time 4    Period Weeks    Status New    Target Date 11/29/20      PT LONG TERM GOAL #2   Title Patient will be able to walk down hall without deviating to the right to demonstrate improved ambulatory mobility    Time 4    Period Weeks    Status New    Target Date 11/29/20      PT LONG TERM GOAL #3   Title Patient will score with perfect DGI score to demonstrate improved dynamic balance    Time 4    Period Weeks    Status New    Target Date 11/29/20                 Plan - 11/09/20 1744    Clinical Impression Statement Reviewed form with HEP initial session, pt able to recall and demonstrate good mechanics.  Session  focus with LE strengthening and balance training.  Pt progressing well with new activities, able to transition to foam with tandem stance and tandem gait with SBA.  No deficits noted with DGI activities and no drifting during gait.    Examination-Activity Limitations Locomotion Level    Examination-Participation Restrictions Community Activity    Stability/Clinical Decision Making Stable/Uncomplicated    Clinical Decision Making Low    Rehab Potential Good    PT Frequency 2x / week    PT Duration 4 weeks    PT Treatment/Interventions ADLs/Self Care Home Management;Biofeedback;Gait training;Functional mobility training;Therapeutic activities;Therapeutic exercise;Balance training;Manual techniques;Patient/family education;Neuromuscular re-education    PT Next Visit Plan Add balance activities to HEP.  Add functional strengthening activities (squats, lunges, stairs).  L LE strengthening, dynamic balance, gait with push off and increasing left LE use    PT Home Exercise Plan bridges, SLR; 5/18: hip abduction and STS    Consulted and Agree with Plan of Care Patient           Patient will benefit from skilled therapeutic intervention in order to improve the following deficits and impairments:  Decreased balance,Difficulty walking,Decreased strength  Visit Diagnosis: Muscle weakness (generalized)  Difficulty in walking, not elsewhere classified     Problem List Patient Active Problem List   Diagnosis Date Noted  . Elevated lipase 05/27/2020  . Early satiety 05/27/2020  . Encounter for screening colonoscopy   . Rectocele, female 03/22/2014  . History of abnormal cervical Pap smear 03/22/2014  . Mild dysplasia of cervix 04/06/2013  . PUD 10/29/2008  . CARPAL TUNNEL SYNDROME 06/05/2006  . HYPERTENSION 06/05/2006  . ACNE NEC 06/05/2006   Becky Sax, LPTA/CLT; CBIS [phone]  Juel Burrow 11/09/2020, 5:53 PM  [facility] [address] [place], [place], [address] Phone: [phone]   Fax:  [phone]  Name: Kristy Oneal MRN: [id number]  Date of Birth: Aug 06, 1950

## 2020-11-15 ENCOUNTER — Other Ambulatory Visit: Payer: Self-pay

## 2020-11-15 ENCOUNTER — Ambulatory Visit (HOSPITAL_COMMUNITY): Payer: Medicare Other | Admitting: Physical Therapy

## 2020-11-15 DIAGNOSIS — R262 Difficulty in walking, not elsewhere classified: Secondary | ICD-10-CM | POA: Diagnosis not present

## 2020-11-15 DIAGNOSIS — M6281 Muscle weakness (generalized): Secondary | ICD-10-CM

## 2020-11-15 NOTE — Therapy (Signed)
[facility] [address] [place], [place], [address] Phone: [phone]   Fax:  [phone]  Physical Therapy Treatment  Patient Details  Name: Kristy Oneal MRN: [id number] Date of Birth: [birth year] Referring Provider (PT): Newman Pies MD   Encounter Date: 11/15/2020   PT End of Session - 11/15/20 1653    Visit Number 4    Number of Visits 8    Date for PT Re-Evaluation 11/29/20    Authorization Type medicare, secondary ceneric commercial, no auth    Progress Note Due on Visit 10    PT Start Time 1455    PT Stop Time 1538    PT Time Calculation (min) 43 min    Activity Tolerance Patient tolerated treatment well    Behavior During Therapy [facility] for tasks assessed/performed           Past Medical History:  Diagnosis Date  . Abnormal Pap smear   . History of abnormal cervical Pap smear 03/22/2014  . Hypertension   . Rectocele, female 03/22/2014  . Vaginal Pap smear, abnormal     Past Surgical History:  Procedure Laterality Date  . BREAST SURGERY     fibroid removed  . COLONOSCOPY N/A 02/06/2017   Recto-sigmoid colon and sigmoid moderately redundant. Internal hemorrhoids.   . ENDOMETRIAL ABLATION    . tubal liagtion    . TUBAL LIGATION      There were no vitals filed for this visit.   Subjective Assessment - 11/15/20 1456    Subjective Pt reports soreness in her lateral LE's but no real pain.  Reports she's been doing "a little" of her HEP late in the evenings.    Currently in Pain? No/denies                             [facility] Adult PT Treatment/Exercise - 11/15/20 0001      Lumbar Exercises: Standing   Heel Raises 10 reps    Functional Squats 10 reps    Forward Lunge 10 reps    Forward Lunge Limitations onto 4" box no UE's    Other Standing Lumbar Exercises lateral step up with eccentric control and forward step up with opposite power up 10X each LE with 1 HHA intermittent 4" step      Lumbar  Exercises: Seated   Sit to Stand 10 reps    Sit to Stand Limitations eccentric control               Balance Exercises - 11/15/20 0001      Balance Exercises: Standing   Tandem Stance 3 reps;Eyes open;Foam/compliant surface    SLS Eyes open;3 reps;Foam/compliant surface    SLS with Vectors 5 reps;10 secs               PT Short Term Goals - 11/01/20 1642      PT SHORT TERM GOAL #1   Title Patient will report at least 50% improvement in overall symptoms and/or function to demonstrate improved functional mobility    Time 2    Period Weeks    Status New    Target Date 11/15/20      PT SHORT TERM GOAL #2   Title Patient will be independent in self management strategies to improve quality of life and functional outcomes.    Time 2    Period Weeks    Status New    Target Date 11/15/20  PT Long Term Goals - 11/01/20 1642      PT LONG TERM GOAL #1   Title Patient will report at least 50% improvement in overall symptoms and/or function to demonstrate improved functional mobility    Time 4    Period Weeks    Status New    Target Date 11/29/20      PT LONG TERM GOAL #2   Title Patient will be able to walk down hall without deviating to the right to demonstrate improved ambulatory mobility    Time 4    Period Weeks    Status New    Target Date 11/29/20      PT LONG TERM GOAL #3   Title Patient will score with perfect DGI score to demonstrate improved dynamic balance    Time 4    Period Weeks    Status New    Target Date 11/29/20                 Plan - 11/15/20 1653    Clinical Impression Statement Began with standing LE strengthening and balance challenges this session.  Pt able to complete all added strengthening exercises with minimal cues and no complaints of pain.  Pt able to maintain 30" static tandem and SLS balance with ease so increased difficulty with addition of airex pad.  This was much challenging with max static SLS of 5" on  either LE before needing UE assist to establish balance.  Pt was able to continue with full tandem stance on airex without difficulty.  Good control and posturing today with vectors.  Pt able to complete full session with one short seated rest break and no complaints of pain..    Examination-Activity Limitations Locomotion Level    Examination-Participation Restrictions Community Activity    Stability/Clinical Decision Making Stable/Uncomplicated    Rehab Potential Good    PT Frequency 2x / week    PT Duration 4 weeks    PT Treatment/Interventions ADLs/Self Care Home Management;Biofeedback;Gait training;Functional mobility training;Therapeutic activities;Therapeutic exercise;Balance training;Manual techniques;Patient/family education;Neuromuscular re-education    PT Next Visit Plan Add balance activities to HEP next session.  Progress functional strengthening activities and dynamic balance.    PT Home Exercise Plan bridges, SLR; 5/18: hip abduction and STS    Consulted and Agree with Plan of Care Patient           Patient will benefit from skilled therapeutic intervention in order to improve the following deficits and impairments:  Decreased balance,Difficulty walking,Decreased strength  Visit Diagnosis: Muscle weakness (generalized)  Difficulty in walking, not elsewhere classified     Problem List Patient Active Problem List   Diagnosis Date Noted  . Elevated lipase 05/27/2020  . Early satiety 05/27/2020  . Encounter for screening colonoscopy   . Rectocele, female 03/22/2014  . History of abnormal cervical Pap smear 03/22/2014  . Mild dysplasia of cervix 04/06/2013  . PUD 10/29/2008  . CARPAL TUNNEL SYNDROME 06/05/2006  . HYPERTENSION 06/05/2006  . ACNE NEC 06/05/2006   Lurena Nida, PTA/CLT [phone]  Lurena Nida 11/15/2020, 4:55 PM  [facility] [address] [place], [place], [address] Phone: [phone]   Fax:   [phone]  Name: NICCOLE WITTHUHN MRN: [id number] Date of Birth: [birth year]

## 2020-11-16 ENCOUNTER — Encounter (HOSPITAL_COMMUNITY): Payer: Self-pay

## 2020-11-16 ENCOUNTER — Ambulatory Visit (HOSPITAL_COMMUNITY): Payer: Medicare Other

## 2020-11-16 DIAGNOSIS — R262 Difficulty in walking, not elsewhere classified: Secondary | ICD-10-CM | POA: Diagnosis not present

## 2020-11-16 DIAGNOSIS — M6281 Muscle weakness (generalized): Secondary | ICD-10-CM

## 2020-11-16 NOTE — Therapy (Signed)
[facility] [address] [place], [place], [address] Phone: [phone]   Fax:  [phone]  Physical Therapy Treatment  Patient Details  Name: Kristy Oneal MRN: [id number] Date of Birth: [birth year] Referring Provider (PT): Newman Pies MD   Encounter Date: 11/16/2020   PT End of Session - 11/16/20 1549    Visit Number 5    Number of Visits 8    Date for PT Re-Evaluation 11/29/20    Authorization Type medicare, secondary [employer] commercial, no auth    Progress Note Due on Visit 10    PT Start Time 1534    PT Stop Time 1614    PT Time Calculation (min) 40 min    Activity Tolerance Patient tolerated treatment well    Behavior During Therapy [facility] for tasks assessed/performed           Past Medical History:  Diagnosis Date  . Abnormal Pap smear   . History of abnormal cervical Pap smear 03/22/2014  . Hypertension   . Rectocele, female 03/22/2014  . Vaginal Pap smear, abnormal     Past Surgical History:  Procedure Laterality Date  . BREAST SURGERY     fibroid removed  . COLONOSCOPY N/A 02/06/2017   Recto-sigmoid colon and sigmoid moderately redundant. Internal hemorrhoids.   . ENDOMETRIAL ABLATION    . tubal liagtion    . TUBAL LIGATION      There were no vitals filed for this visit.   Subjective Assessment - 11/16/20 1539    Subjective Pt stated she soreness maybe 4/10 lateral Lt thigh.  Reports she has ability to walk straight down hallway.  Has been compliant with HEP.    Patient Stated Goals to be able to walk without deviation to the right    Currently in Pain? No/denies   generalized soreness, intermittent                            [facility] Adult PT Treatment/Exercise - 11/16/20 0001      Lumbar Exercises: Machines for Strengthening   Other Lumbar Machine Exercise BodyCraft 2Pl Retro and sidestep 5RT each      Lumbar Exercises: Standing   Heel Raises 10 reps    Heel Raises Limitations squat to heel  raise, no HHA    Functional Squats 10 reps    Functional Squats Limitations squat to heel raise, no HHA    Forward Lunge 15 reps    Forward Lunge Limitations on floor, no HHA    Other Standing Lumbar Exercises Stairs 6RT initial step to then reciprocal pattern carrying yellow weighted ball               Balance Exercises - 11/16/20 0001      Balance Exercises: Standing   Tandem Stance 30 secs;Foam/compliant surface;4 reps    SLS with Vectors 5 reps;10 secs;Foam/compliant surface;Intermittent upper extremity assist    Balance Beam Tandem and sidestep 2RT               PT Short Term Goals - 11/01/20 1642      PT SHORT TERM GOAL #1   Title Patient will report at least 50% improvement in overall symptoms and/or function to demonstrate improved functional mobility    Time 2    Period Weeks    Status New    Target Date 11/15/20      PT SHORT TERM GOAL #2   Title Patient  will be independent in self management strategies to improve quality of life and functional outcomes.    Time 2    Period Weeks    Status New    Target Date 11/15/20             PT Long Term Goals - 11/01/20 1642      PT LONG TERM GOAL #1   Title Patient will report at least 50% improvement in overall symptoms and/or function to demonstrate improved functional mobility    Time 4    Period Weeks    Status New    Target Date 11/29/20      PT LONG TERM GOAL #2   Title Patient will be able to walk down hall without deviating to the right to demonstrate improved ambulatory mobility    Time 4    Period Weeks    Status New    Target Date 11/29/20      PT LONG TERM GOAL #3   Title Patient will score with perfect DGI score to demonstrate improved dynamic balance    Time 4    Period Weeks    Status New    Target Date 11/29/20                 Plan - 11/16/20 1618    Clinical Impression Statement Pt progressing well towards POC.  Progressed balance and LE strengthening with additional  exericses.  Pt presents with good stability with tandem stance, added dynamic surfance and head turns with increased difficulty.  Combined some exercises to progress functional strengthenig included squat to heel raise and carrying items up stairs.  No reports of pain through session, was limited by fatigue.  Added vector stance and tandem stance (with head turns) to HEP.    Examination-Activity Limitations Locomotion Level    Examination-Participation Restrictions Community Activity    Stability/Clinical Decision Making Stable/Uncomplicated    Clinical Decision Making Low    Rehab Potential Good    PT Frequency 2x / week    PT Duration 4 weeks    PT Treatment/Interventions ADLs/Self Care Home Management;Biofeedback;Gait training;Functional mobility training;Therapeutic activities;Therapeutic exercise;Balance training;Manual techniques;Patient/family education;Neuromuscular re-education    PT Next Visit Plan Progress functional strengthening activities and dynamic balance.    PT Home Exercise Plan bridges, SLR; 5/18: hip abduction and STS; 5/11: vector stance and tandem stance (additional head turns).    Consulted and Agree with Plan of Care Patient;Family member/caregiver    Family Member Consulted Daughter sat through session.           Patient will benefit from skilled therapeutic intervention in order to improve the following deficits and impairments:  Decreased balance,Difficulty walking,Decreased strength  Visit Diagnosis: Muscle weakness (generalized)  Difficulty in walking, not elsewhere classified     Problem List Patient Active Problem List   Diagnosis Date Noted  . Elevated lipase 05/27/2020  . Early satiety 05/27/2020  . Encounter for screening colonoscopy   . Rectocele, female 03/22/2014  . History of abnormal cervical Pap smear 03/22/2014  . Mild dysplasia of cervix 04/06/2013  . PUD 10/29/2008  . CARPAL TUNNEL SYNDROME 06/05/2006  . HYPERTENSION 06/05/2006  .  ACNE NEC 06/05/2006   Becky Sax, LPTA/CLT; CBIS [phone]  Juel Burrow 11/16/2020, 4:26 PM  [facility] [address] [place], [place], [address] Phone: [phone]   Fax:  [phone]  Name: JORDYN HOFACKER MRN: [id number] Date of Birth: [birth year]

## 2020-11-16 NOTE — Patient Instructions (Addendum)
   Standing tall with hand assistance bring leg forward and hold for 5", without touching down bring leg out to side and hold 5" then extend hip hold for 5".  Tandem Stance    Right foot in front of left, heel touching toe both feet "straight ahead". Stand on Foot Triangle of Support with both feet. Balance in this position 30 seconds. Do with left foot in front of right. Add head turns.   Copyright  VHI. All rights reserved.

## 2020-11-21 ENCOUNTER — Ambulatory Visit (HOSPITAL_COMMUNITY): Payer: Medicare Other | Admitting: Physical Therapy

## 2020-11-21 ENCOUNTER — Other Ambulatory Visit: Payer: Self-pay

## 2020-11-21 ENCOUNTER — Encounter (HOSPITAL_COMMUNITY): Payer: Self-pay | Admitting: Physical Therapy

## 2020-11-21 DIAGNOSIS — M6281 Muscle weakness (generalized): Secondary | ICD-10-CM

## 2020-11-21 DIAGNOSIS — R262 Difficulty in walking, not elsewhere classified: Secondary | ICD-10-CM

## 2020-11-21 NOTE — Therapy (Signed)
[facility] [address] [place], [place], [address] Phone: [phone]   Fax:  [phone]  Physical Therapy Treatment and Discharge Note  Patient Details  Name: LESTA LIMBERT MRN: [id number] Date of Birth: [birth year] Referring Provider (PT): Leta Baptist MD   PHYSICAL THERAPY DISCHARGE SUMMARY  Visits from Start of Care: 6  Current functional level related to goals / functional outcomes: All goals met    Remaining deficits: none   Education / Equipment: See  Plan: Patient agrees to discharge.  Patient goals were partially met. Patient is being discharged due to meeting the stated rehab goals.  ?????       Encounter Date: 11/21/2020   PT End of Session - 11/21/20 1616    Visit Number 6    Number of Visits 8    Date for PT Re-Evaluation 11/29/20    Authorization Type medicare, secondary [employer]ener[employer] commercial, no auth    Progress Note Due on Visit 10    PT Start Time 1615    PT Stop Time 1638    PT Time Calculation (min) 23 min    Activity Tolerance Patient tolerated treatment well    Behavior During Therapy [facility] for tasks assessed/performed           Past Medical History:  Diagnosis Date  . Abnormal Pap smear   . History of abnormal cervical Pap smear 03/22/2014  . Hypertension   . Rectocele, female 03/22/2014  . Vaginal Pap smear, abnormal     Past Surgical History:  Procedure Laterality Date  . BREAST SURGERY     fibroid removed  . COLONOSCOPY N/A 02/06/2017   Recto-sigmoid colon and sigmoid moderately redundant. Internal hemorrhoids.   . ENDOMETRIAL ABLATION    . tubal liagtion    . TUBAL LIGATION      There were no vitals filed for this visit.       [facility] PT Assessment - 11/21/20 0001      Assessment   Medical Diagnosis dizziness    Referring Provider (PT) Leta Baptist MD      Strength   Right/Left Hip Right;Left    Right Hip Flexion 5/5    Right Hip Extension 4/5    Right Hip ABduction 4/5    Left Hip  Flexion 5/5    Left Hip Extension 4/5    Left Hip ABduction 4/5    Right Knee Flexion 5/5    Right Knee Extension 5/5    Left Knee Flexion 5/5    Left Knee Extension 5/5    Right Ankle Dorsiflexion 5/5    Right Ankle Plantar Flexion 5/5   20 single leg heel raises   Left Ankle Dorsiflexion 5/5    Left Ankle Plantar Flexion 5/5   20 single leg heel raises     Dynamic Gait Index   Level Surface Normal    Change in Gait Speed Normal    Gait with Horizontal Head Turns Normal    Gait with Vertical Head Turns Normal    Gait and Pivot Turn Normal    Step Over Obstacle Normal    Step Around Obstacles Normal    Steps Normal    Total Score 24                                   PT Short Term Goals - 11/21/20 1619      PT SHORT  TERM GOAL #1   Title Patient will report at least 50% improvement in overall symptoms and/or function to demonstrate improved functional mobility    Time 2    Period Weeks    Status Achieved    Target Date 11/15/20      PT SHORT TERM GOAL #2   Title Patient will be independent in self management strategies to improve quality of life and functional outcomes.    Baseline performs them every other day    Time 2    Period Weeks    Status Achieved    Target Date 11/15/20             PT Long Term Goals - 11/21/20 1619      PT LONG TERM GOAL #1   Title Patient will report at least 75% improvement in overall symptoms and/or function to demonstrate improved functional mobility    Time 4    Period Weeks    Status Achieved      PT LONG TERM GOAL #2   Title Patient will be able to walk down hall without deviating to the right to demonstrate improved ambulatory mobility    Time 4    Period Weeks    Status Achieved      PT LONG TERM GOAL #3   Title Patient will score with perfect DGI score to demonstrate improved dynamic balance    Time 4    Period Weeks    Status Achieved                 Plan - 11/21/20 1635     Clinical Impression Statement All goals met at this time. Reviewed progress, HEP and answered all questions. Patient to discharge from PT to HEP secondary to progress made.    Examination-Activity Limitations Locomotion Level    Examination-Participation Restrictions Community Activity    Stability/Clinical Decision Making Stable/Uncomplicated    Rehab Potential Good    PT Frequency 2x / week    PT Duration 4 weeks    PT Treatment/Interventions ADLs/Self Care Home Management;Biofeedback;Gait training;Functional mobility training;Therapeutic activities;Therapeutic exercise;Balance training;Manual techniques;Patient/family education;Neuromuscular re-education    PT Next Visit Plan pt to discharge from PT to HEP.    PT Home Exercise Plan bridges, SLR; 5/18: hip abduction and STS; 5/11: vector stance and tandem stance (additional head turns).    Consulted and Agree with Plan of Care Patient;Family member/caregiver    Family Member Consulted Daughter sat through session.           Patient will benefit from skilled therapeutic intervention in order to improve the following deficits and impairments:  Decreased balance,Difficulty walking,Decreased strength  Visit Diagnosis: Muscle weakness (generalized)  Difficulty in walking, not elsewhere classified     Problem List Patient Active Problem List   Diagnosis Date Noted  . Elevated lipase 05/27/2020  . Early satiety 05/27/2020  . Encounter for screening colonoscopy   . Rectocele, female 03/22/2014  . History of abnormal cervical Pap smear 03/22/2014  . Mild dysplasia of cervix 04/06/2013  . PUD 10/29/2008  . CARPAL TUNNEL SYNDROME 06/05/2006  . HYPERTENSION 06/05/2006  . ACNE NEC 06/05/2006   4:39 PM, 11/21/20 Jerene Pitch, DPT Physical Therapy with [facility]  [phone] office  [place] [address] [place], [place], [address] Phone: [phone]   Fax:   [phone]  Name: SEHAR SEDANO MRN: [id number] Date of Birth: Dec 11, 1950

## 2020-11-23 ENCOUNTER — Ambulatory Visit (HOSPITAL_COMMUNITY): Payer: Medicare Other | Admitting: Physical Therapy

## 2020-11-29 ENCOUNTER — Ambulatory Visit (HOSPITAL_COMMUNITY): Payer: Medicare Other

## 2020-11-30 ENCOUNTER — Ambulatory Visit (HOSPITAL_COMMUNITY): Payer: Medicare Other

## 2020-12-06 ENCOUNTER — Ambulatory Visit (HOSPITAL_COMMUNITY): Payer: Medicare Other | Admitting: Physical Therapy

## 2021-05-01 DIAGNOSIS — Z1231 Encounter for screening mammogram for malignant neoplasm of breast: Secondary | ICD-10-CM | POA: Diagnosis not present

## 2021-05-16 DIAGNOSIS — Z23 Encounter for immunization: Secondary | ICD-10-CM | POA: Diagnosis not present

## 2021-05-21 DIAGNOSIS — Z23 Encounter for immunization: Secondary | ICD-10-CM | POA: Diagnosis not present

## 2021-10-18 DIAGNOSIS — J020 Streptococcal pharyngitis: Secondary | ICD-10-CM | POA: Diagnosis not present

## 2021-10-18 DIAGNOSIS — J069 Acute upper respiratory infection, unspecified: Secondary | ICD-10-CM | POA: Diagnosis not present

## 2022-04-02 DIAGNOSIS — Z23 Encounter for immunization: Secondary | ICD-10-CM | POA: Diagnosis not present

## 2022-04-02 DIAGNOSIS — Z1331 Encounter for screening for depression: Secondary | ICD-10-CM | POA: Diagnosis not present

## 2022-04-02 DIAGNOSIS — Z0001 Encounter for general adult medical examination with abnormal findings: Secondary | ICD-10-CM | POA: Diagnosis not present

## 2022-04-02 DIAGNOSIS — J209 Acute bronchitis, unspecified: Secondary | ICD-10-CM | POA: Diagnosis not present

## 2022-04-02 DIAGNOSIS — Z6822 Body mass index (BMI) 22.0-22.9, adult: Secondary | ICD-10-CM | POA: Diagnosis not present

## 2022-04-02 DIAGNOSIS — I10 Essential (primary) hypertension: Secondary | ICD-10-CM | POA: Diagnosis not present

## 2022-04-06 DIAGNOSIS — Z0001 Encounter for general adult medical examination with abnormal findings: Secondary | ICD-10-CM | POA: Diagnosis not present

## 2022-04-06 DIAGNOSIS — I10 Essential (primary) hypertension: Secondary | ICD-10-CM | POA: Diagnosis not present

## 2022-05-07 DIAGNOSIS — Z1231 Encounter for screening mammogram for malignant neoplasm of breast: Secondary | ICD-10-CM | POA: Diagnosis not present

## 2022-05-18 DIAGNOSIS — Z23 Encounter for immunization: Secondary | ICD-10-CM | POA: Diagnosis not present

## 2022-07-10 DIAGNOSIS — J209 Acute bronchitis, unspecified: Secondary | ICD-10-CM | POA: Diagnosis not present

## 2022-07-10 DIAGNOSIS — J019 Acute sinusitis, unspecified: Secondary | ICD-10-CM | POA: Diagnosis not present

## 2022-07-10 DIAGNOSIS — J069 Acute upper respiratory infection, unspecified: Secondary | ICD-10-CM | POA: Diagnosis not present

## 2022-08-06 DIAGNOSIS — J189 Pneumonia, unspecified organism: Secondary | ICD-10-CM | POA: Diagnosis not present

## 2022-10-03 DIAGNOSIS — J209 Acute bronchitis, unspecified: Secondary | ICD-10-CM | POA: Diagnosis not present

## 2022-10-03 DIAGNOSIS — J069 Acute upper respiratory infection, unspecified: Secondary | ICD-10-CM | POA: Diagnosis not present

## 2022-10-28 DIAGNOSIS — M62830 Muscle spasm of back: Secondary | ICD-10-CM | POA: Diagnosis not present

## 2022-10-28 DIAGNOSIS — M545 Low back pain, unspecified: Secondary | ICD-10-CM | POA: Diagnosis not present

## 2022-10-28 DIAGNOSIS — Z6822 Body mass index (BMI) 22.0-22.9, adult: Secondary | ICD-10-CM | POA: Diagnosis not present

## 2023-04-05 DIAGNOSIS — Z6822 Body mass index (BMI) 22.0-22.9, adult: Secondary | ICD-10-CM | POA: Diagnosis not present

## 2023-04-05 DIAGNOSIS — I10 Essential (primary) hypertension: Secondary | ICD-10-CM | POA: Diagnosis not present

## 2023-04-05 DIAGNOSIS — Z1231 Encounter for screening mammogram for malignant neoplasm of breast: Secondary | ICD-10-CM | POA: Diagnosis not present

## 2023-04-05 DIAGNOSIS — Z0001 Encounter for general adult medical examination with abnormal findings: Secondary | ICD-10-CM | POA: Diagnosis not present

## 2023-04-05 DIAGNOSIS — Z23 Encounter for immunization: Secondary | ICD-10-CM | POA: Diagnosis not present

## 2023-04-05 DIAGNOSIS — Z1331 Encounter for screening for depression: Secondary | ICD-10-CM | POA: Diagnosis not present

## 2023-04-15 DIAGNOSIS — Z0001 Encounter for general adult medical examination with abnormal findings: Secondary | ICD-10-CM | POA: Diagnosis not present

## 2023-04-15 DIAGNOSIS — I10 Essential (primary) hypertension: Secondary | ICD-10-CM | POA: Diagnosis not present

## 2023-05-13 DIAGNOSIS — Z1231 Encounter for screening mammogram for malignant neoplasm of breast: Secondary | ICD-10-CM | POA: Diagnosis not present

## 2023-06-11 DIAGNOSIS — M791 Myalgia, unspecified site: Secondary | ICD-10-CM | POA: Diagnosis not present

## 2023-06-11 DIAGNOSIS — J069 Acute upper respiratory infection, unspecified: Secondary | ICD-10-CM | POA: Diagnosis not present

## 2023-06-11 DIAGNOSIS — R509 Fever, unspecified: Secondary | ICD-10-CM | POA: Diagnosis not present

## 2023-06-11 DIAGNOSIS — J029 Acute pharyngitis, unspecified: Secondary | ICD-10-CM | POA: Diagnosis not present

## 2023-06-14 DIAGNOSIS — J019 Acute sinusitis, unspecified: Secondary | ICD-10-CM | POA: Diagnosis not present

## 2023-06-14 DIAGNOSIS — J189 Pneumonia, unspecified organism: Secondary | ICD-10-CM | POA: Diagnosis not present

## 2023-07-05 DIAGNOSIS — J189 Pneumonia, unspecified organism: Secondary | ICD-10-CM | POA: Diagnosis not present

## 2023-09-13 ENCOUNTER — Other Ambulatory Visit (HOSPITAL_COMMUNITY): Payer: Self-pay | Admitting: Otolaryngology

## 2023-09-13 DIAGNOSIS — M25562 Pain in left knee: Secondary | ICD-10-CM

## 2023-09-21 ENCOUNTER — Ambulatory Visit (HOSPITAL_COMMUNITY)
Admission: RE | Admit: 2023-09-21 | Discharge: 2023-09-21 | Disposition: A | Discharge status: Home / Self Care | Source: Ambulatory Visit | Attending: Otolaryngology | Admitting: Otolaryngology

## 2023-09-21 DIAGNOSIS — M25562 Pain in left knee: Secondary | ICD-10-CM | POA: Insufficient documentation

## 2024-01-24 ENCOUNTER — Encounter: Payer: Self-pay | Admitting: Advanced Practice Midwife
